# Patient Record
Sex: Female | Born: 1940 | Race: Black or African American | Hispanic: No | Marital: Married | Smoking: Former smoker
Health system: Southern US, Community
[De-identification: ages and names within clinical notes are randomized; demographics above are authoritative.]

## PROBLEM LIST (undated history)

## (undated) DIAGNOSIS — J449 Chronic obstructive pulmonary disease, unspecified: Secondary | ICD-10-CM

## (undated) DIAGNOSIS — I10 Essential (primary) hypertension: Secondary | ICD-10-CM

## (undated) DIAGNOSIS — I509 Heart failure, unspecified: Secondary | ICD-10-CM

## (undated) DIAGNOSIS — E785 Hyperlipidemia, unspecified: Secondary | ICD-10-CM

## (undated) DIAGNOSIS — J189 Pneumonia, unspecified organism: Secondary | ICD-10-CM

## (undated) HISTORY — DX: Essential (primary) hypertension: I10

## (undated) HISTORY — PX: TOTAL ABDOMINAL HYSTERECTOMY: SHX209

## (undated) HISTORY — DX: Chronic obstructive pulmonary disease, unspecified: J44.9

## (undated) HISTORY — DX: Heart failure, unspecified: I50.9

## (undated) HISTORY — PX: COLECTOMY: SHX59

## (undated) HISTORY — PX: HERNIA REPAIR: SHX51

## (undated) HISTORY — PX: HIP SURGERY: SHX245

---

## 2006-11-24 ENCOUNTER — Encounter: Payer: Self-pay | Admitting: Internal Medicine

## 2006-11-24 ENCOUNTER — Ambulatory Visit: Payer: Self-pay | Admitting: Cardiology

## 2007-01-06 ENCOUNTER — Ambulatory Visit (HOSPITAL_COMMUNITY): Admission: RE | Admit: 2007-01-06 | Discharge: 2007-01-06 | Payer: Self-pay | Admitting: Cardiology

## 2007-01-13 ENCOUNTER — Encounter: Payer: Self-pay | Admitting: Internal Medicine

## 2007-01-13 ENCOUNTER — Ambulatory Visit (HOSPITAL_COMMUNITY): Admission: RE | Admit: 2007-01-13 | Discharge: 2007-01-13 | Payer: Self-pay | Admitting: Cardiology

## 2007-02-16 ENCOUNTER — Encounter: Payer: Self-pay | Admitting: Internal Medicine

## 2008-10-09 ENCOUNTER — Ambulatory Visit: Admission: RE | Admit: 2008-10-09 | Discharge: 2008-10-09 | Payer: Self-pay | Admitting: Internal Medicine

## 2008-10-09 ENCOUNTER — Ambulatory Visit: Payer: Self-pay | Admitting: Internal Medicine

## 2008-10-09 DIAGNOSIS — J309 Allergic rhinitis, unspecified: Secondary | ICD-10-CM | POA: Insufficient documentation

## 2008-10-09 DIAGNOSIS — J4489 Other specified chronic obstructive pulmonary disease: Secondary | ICD-10-CM | POA: Insufficient documentation

## 2008-10-09 DIAGNOSIS — I10 Essential (primary) hypertension: Secondary | ICD-10-CM | POA: Insufficient documentation

## 2008-10-09 DIAGNOSIS — J449 Chronic obstructive pulmonary disease, unspecified: Secondary | ICD-10-CM | POA: Insufficient documentation

## 2008-10-09 DIAGNOSIS — R059 Cough, unspecified: Secondary | ICD-10-CM | POA: Insufficient documentation

## 2008-10-09 DIAGNOSIS — R05 Cough: Secondary | ICD-10-CM

## 2008-11-01 ENCOUNTER — Ambulatory Visit: Payer: Self-pay | Admitting: Internal Medicine

## 2008-11-07 ENCOUNTER — Encounter: Payer: Self-pay | Admitting: Internal Medicine

## 2008-11-11 ENCOUNTER — Ambulatory Visit: Payer: Self-pay | Admitting: Internal Medicine

## 2008-11-14 ENCOUNTER — Telehealth: Payer: Self-pay | Admitting: Internal Medicine

## 2009-01-21 ENCOUNTER — Inpatient Hospital Stay (HOSPITAL_COMMUNITY): Admission: RE | Admit: 2009-01-21 | Discharge: 2009-01-29 | Payer: Self-pay | Admitting: Orthopedic Surgery

## 2009-01-21 ENCOUNTER — Ambulatory Visit: Payer: Self-pay | Admitting: Pulmonary Disease

## 2009-01-24 ENCOUNTER — Ambulatory Visit: Payer: Self-pay | Admitting: Vascular Surgery

## 2009-01-24 ENCOUNTER — Encounter (INDEPENDENT_AMBULATORY_CARE_PROVIDER_SITE_OTHER): Payer: Self-pay | Admitting: Orthopedic Surgery

## 2010-07-16 IMAGING — CR DG PORTABLE PELVIS
1 series · 1 of 1 positions shown · non-contrast
Comparison: 01/21/2009

CLINICAL DATA: Osteoarthritis of the right hip.  Arthroplasty.

PORTABLE PELVIS

[AP]
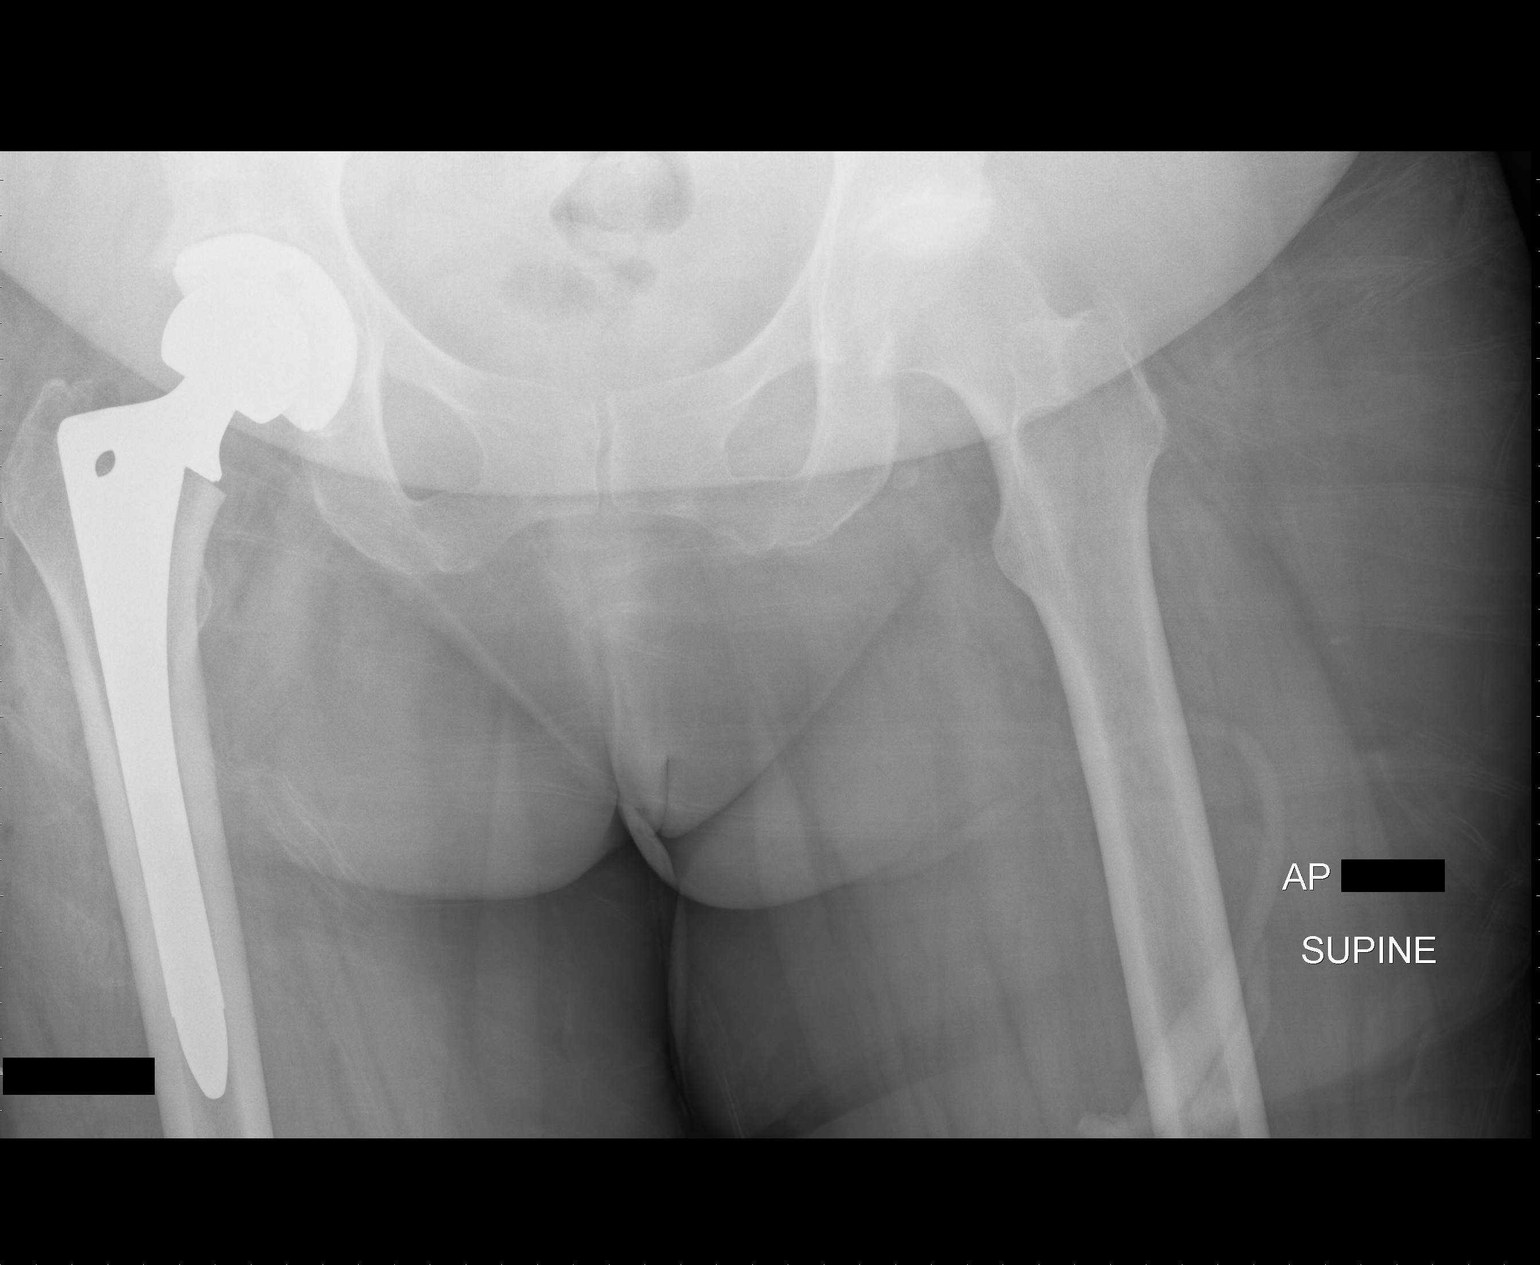

[1 of 1 positions shown; findings below may reference images not displayed]

FINDINGS: Frontal projection of the pelvis centered on the lower
pelvis demonstrates right-sided hip prosthesis in place without
fracture or acute complicating feature identified.  The soft
tissues lateral to the greater trochanter were excluded.
IMPRESSION: 1.  Right hip prosthesis is in place without fracture or acute
complicating feature.

## 2010-07-16 IMAGING — CR DG CHEST 2V
2 series · 2 of 2 positions shown · non-contrast
Comparison: None

CLINICAL DATA: Right hip osteoarthritis.  Shortness of breath.
COPD.  Hypertension.

CHEST - 2 VIEW

[view not recorded (1 of 2)]
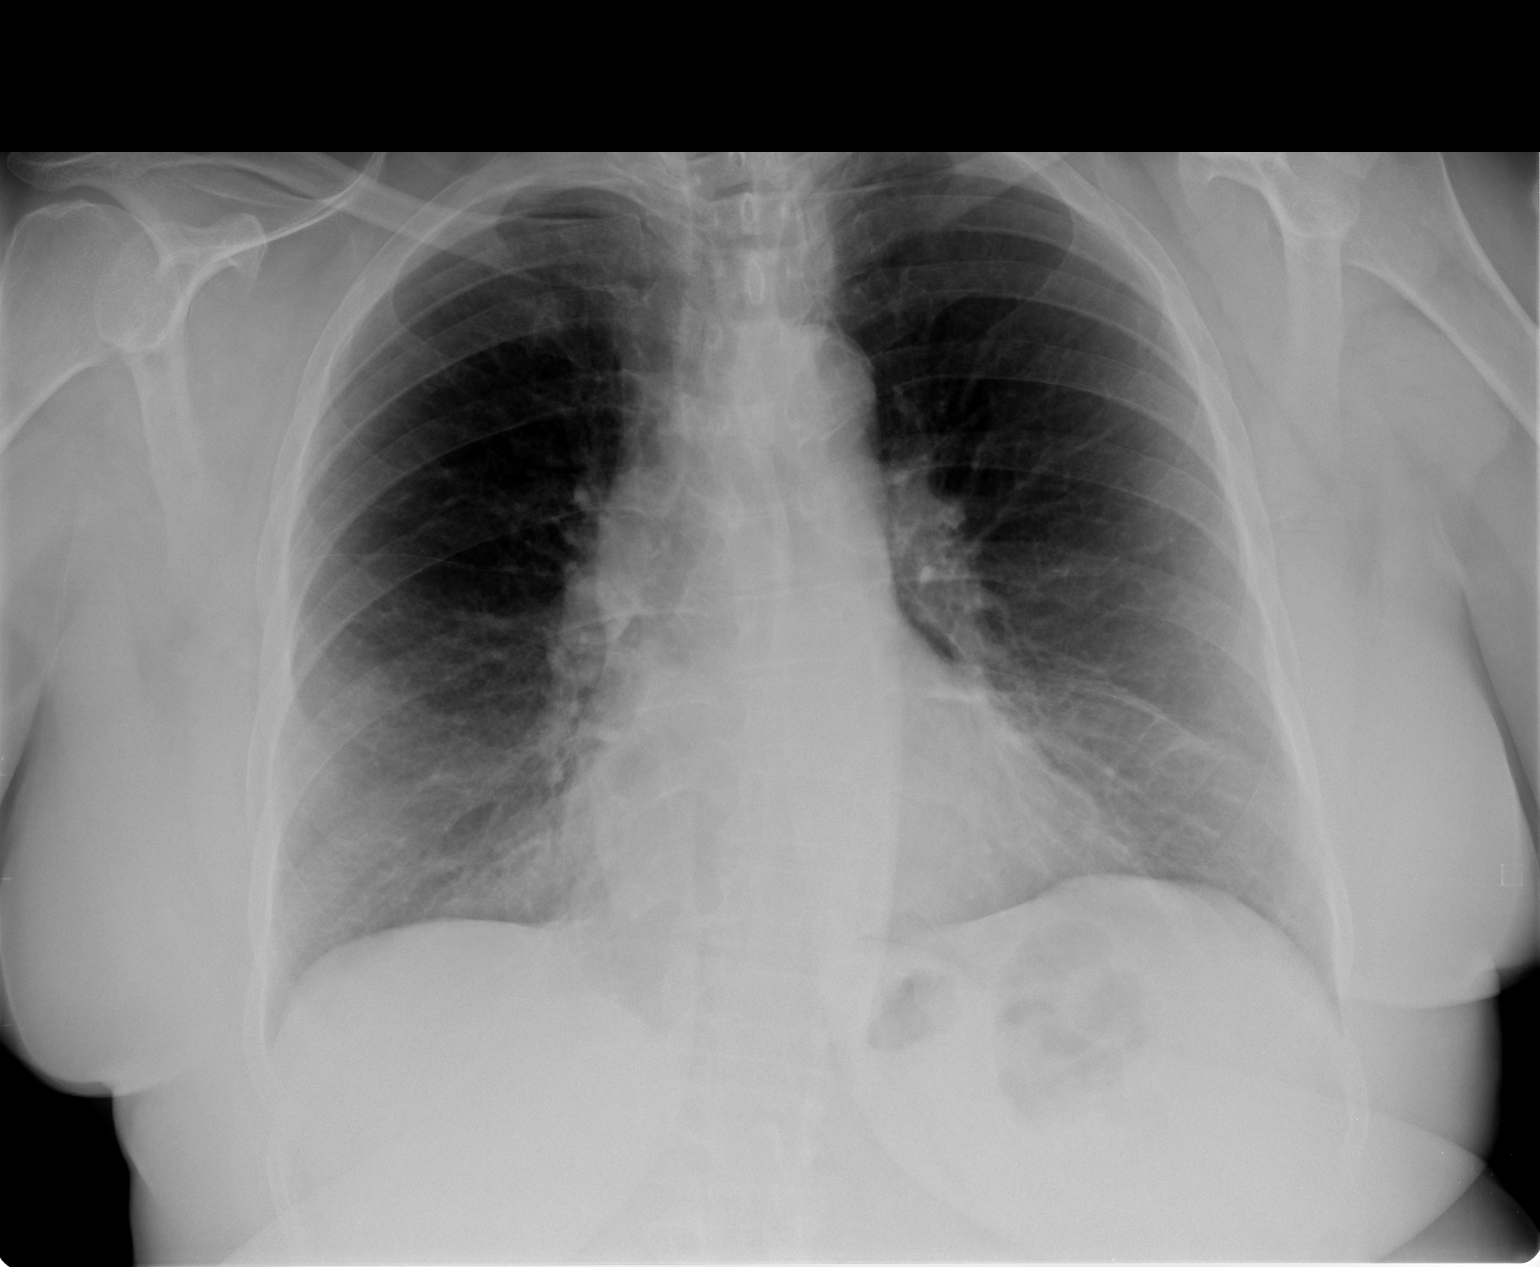

[view not recorded (2 of 2)]
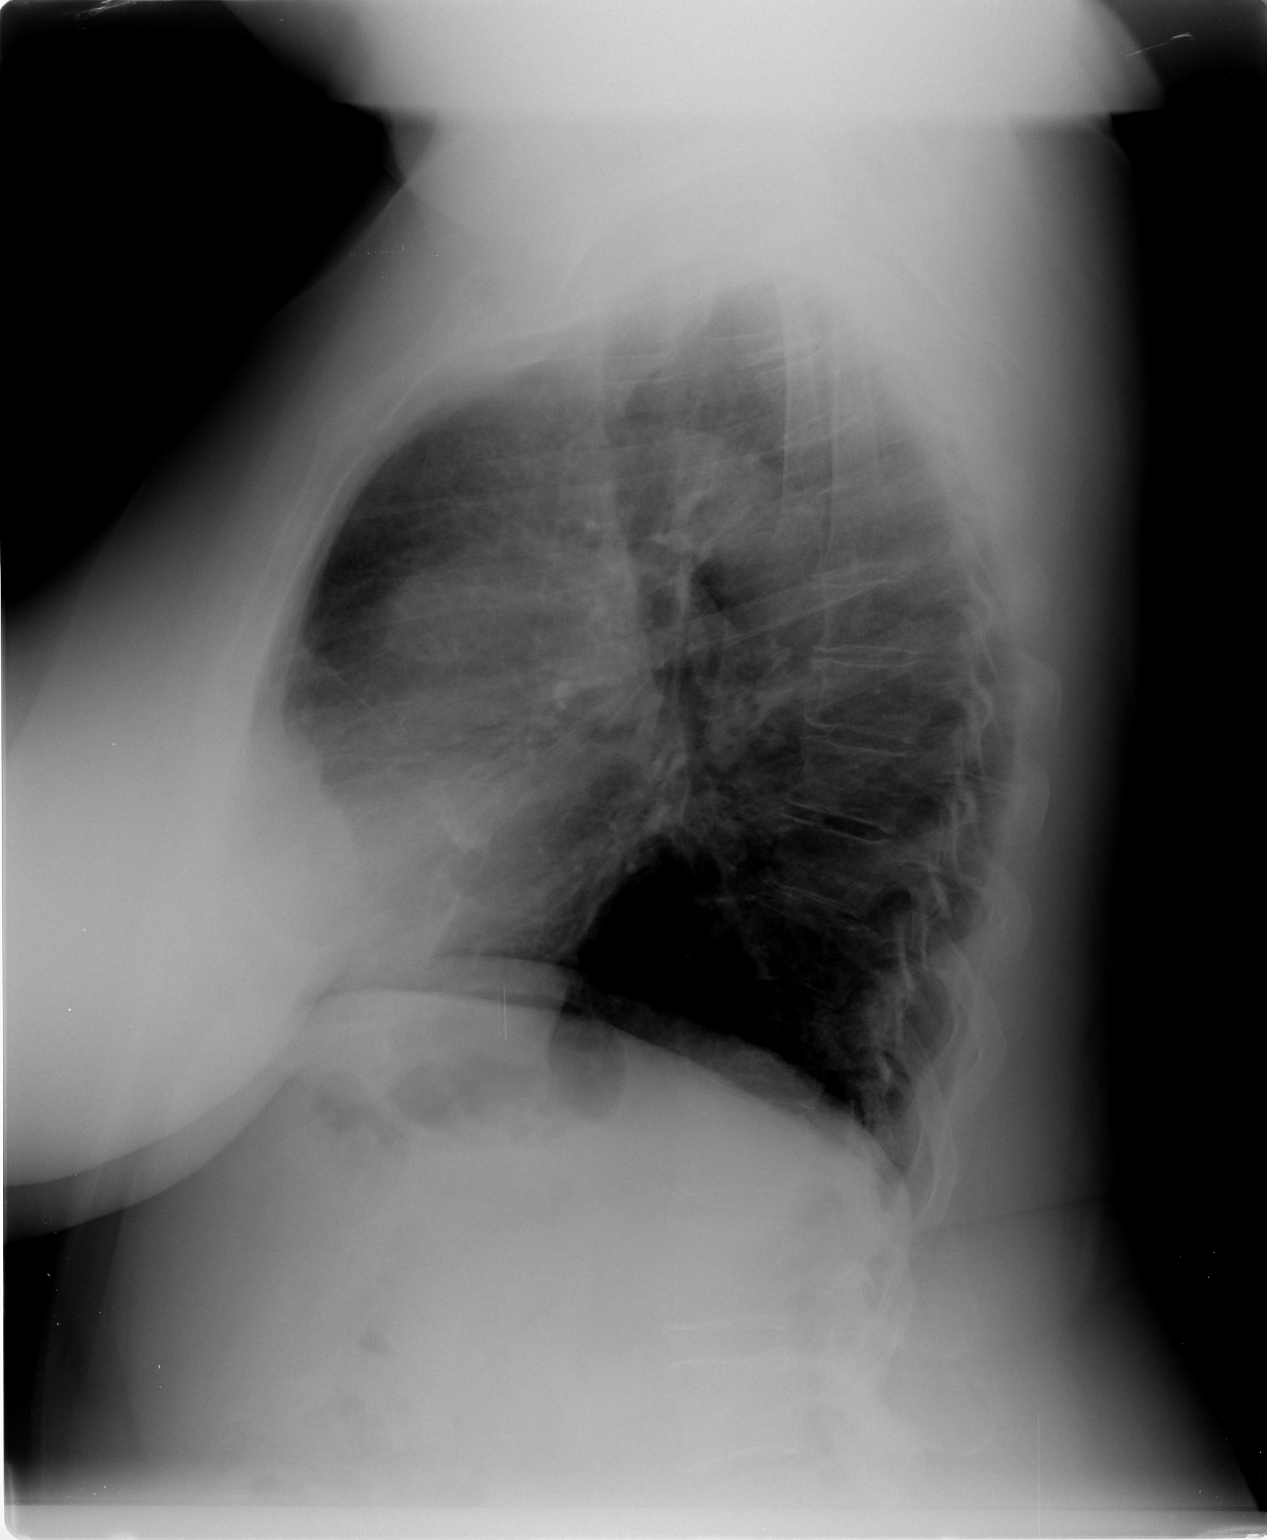

[2 of 2 positions shown; findings below may reference images not displayed]

FINDINGS: There is apparent volume loss and linear density in the
lingula most compatible with atelectasis or scarring.

Faint linear density projecting over the right first rib and likely
represents a confluence of osseous and vascular shadows.
IMPRESSION: 1.  Lingular scarring versus subsegmental atelectasis.
2.  No acute thoracic findings are identified.

## 2010-09-12 LAB — PROTIME-INR
INR: 1.7 — ABNORMAL HIGH (ref 0.00–1.49)
INR: 1.9 — ABNORMAL HIGH (ref 0.00–1.49)
Prothrombin Time: 14 seconds (ref 11.6–15.2)
Prothrombin Time: 24.1 seconds — ABNORMAL HIGH (ref 11.6–15.2)
Prothrombin Time: 24.2 seconds — ABNORMAL HIGH (ref 11.6–15.2)

## 2010-09-12 LAB — CARDIAC PANEL(CRET KIN+CKTOT+MB+TROPI)
CK, MB: 2.7 ng/mL (ref 0.3–4.0)
Relative Index: 0.9 (ref 0.0–2.5)
Total CK: 246 U/L — ABNORMAL HIGH (ref 7–177)
Total CK: 269 U/L — ABNORMAL HIGH (ref 7–177)
Troponin I: 0.02 ng/mL (ref 0.00–0.06)
Troponin I: 0.02 ng/mL (ref 0.00–0.06)

## 2010-09-12 LAB — CBC
HCT: 23.7 % — ABNORMAL LOW (ref 36.0–46.0)
HCT: 29.8 % — ABNORMAL LOW (ref 36.0–46.0)
Hemoglobin: 10.2 g/dL — ABNORMAL LOW (ref 12.0–15.0)
Hemoglobin: 8 g/dL — ABNORMAL LOW (ref 12.0–15.0)
Hemoglobin: 9.6 g/dL — ABNORMAL LOW (ref 12.0–15.0)
MCHC: 33.7 g/dL (ref 30.0–36.0)
MCHC: 33.8 g/dL (ref 30.0–36.0)
MCHC: 34 g/dL (ref 30.0–36.0)
MCHC: 34.4 g/dL (ref 30.0–36.0)
MCHC: 34.6 g/dL (ref 30.0–36.0)
MCV: 92.8 fL (ref 78.0–100.0)
MCV: 93.2 fL (ref 78.0–100.0)
MCV: 94.5 fL (ref 78.0–100.0)
Platelets: 206 10*3/uL (ref 150–400)
Platelets: 207 10*3/uL (ref 150–400)
Platelets: 242 10*3/uL (ref 150–400)
Platelets: 255 10*3/uL (ref 150–400)
RBC: 3.02 MIL/uL — ABNORMAL LOW (ref 3.87–5.11)
RBC: 3.21 MIL/uL — ABNORMAL LOW (ref 3.87–5.11)
RBC: 3.29 MIL/uL — ABNORMAL LOW (ref 3.87–5.11)
RDW: 15 % (ref 11.5–15.5)
RDW: 15.1 % (ref 11.5–15.5)
RDW: 15.1 % (ref 11.5–15.5)
WBC: 6.2 10*3/uL (ref 4.0–10.5)
WBC: 6.4 10*3/uL (ref 4.0–10.5)
WBC: 6.7 10*3/uL (ref 4.0–10.5)
WBC: 6.9 10*3/uL (ref 4.0–10.5)
WBC: 7.1 10*3/uL (ref 4.0–10.5)
WBC: 7.2 10*3/uL (ref 4.0–10.5)

## 2010-09-12 LAB — HEPARIN LEVEL (UNFRACTIONATED)
Heparin Unfractionated: 0.19 IU/mL — ABNORMAL LOW (ref 0.30–0.70)
Heparin Unfractionated: 0.21 IU/mL — ABNORMAL LOW (ref 0.30–0.70)
Heparin Unfractionated: 0.33 IU/mL (ref 0.30–0.70)
Heparin Unfractionated: 0.46 IU/mL (ref 0.30–0.70)
Heparin Unfractionated: 0.48 IU/mL (ref 0.30–0.70)
Heparin Unfractionated: 0.92 IU/mL — ABNORMAL HIGH (ref 0.30–0.70)

## 2010-09-12 LAB — BASIC METABOLIC PANEL
CO2: 33 mEq/L — ABNORMAL HIGH (ref 19–32)
Calcium: 8 mg/dL — ABNORMAL LOW (ref 8.4–10.5)
Calcium: 8.3 mg/dL — ABNORMAL LOW (ref 8.4–10.5)
Calcium: 8.4 mg/dL (ref 8.4–10.5)
Calcium: 8.9 mg/dL (ref 8.4–10.5)
Chloride: 95 mEq/L — ABNORMAL LOW (ref 96–112)
Creatinine, Ser: 0.68 mg/dL (ref 0.4–1.2)
Creatinine, Ser: 0.7 mg/dL (ref 0.4–1.2)
Creatinine, Ser: 0.75 mg/dL (ref 0.4–1.2)
Creatinine, Ser: 0.88 mg/dL (ref 0.4–1.2)
GFR calc Af Amer: >60 mL/min (ref >60)
GFR calc Af Amer: >60 mL/min (ref >60)
GFR calc Af Amer: >60 mL/min (ref >60)
GFR calc non Af Amer: 41 mL/min — ABNORMAL LOW (ref >60)
GFR calc non Af Amer: >60 mL/min (ref >60)
Glucose, Bld: 152 mg/dL — ABNORMAL HIGH (ref 70–99)
Glucose, Bld: 157 mg/dL — ABNORMAL HIGH (ref 70–99)
Glucose, Bld: 165 mg/dL — ABNORMAL HIGH (ref 70–99)
Potassium: 4.3 mEq/L (ref 3.5–5.1)
Sodium: 132 mEq/L — ABNORMAL LOW (ref 135–145)
Sodium: 133 mEq/L — ABNORMAL LOW (ref 135–145)

## 2010-09-12 LAB — URINALYSIS, ROUTINE W REFLEX MICROSCOPIC
Bilirubin Urine: NEGATIVE
Glucose, UA: NEGATIVE mg/dL
Ketones, ur: NEGATIVE mg/dL
Nitrite: NEGATIVE
Protein, ur: NEGATIVE mg/dL
Specific Gravity, Urine: 1.008 (ref 1.005–1.030)
Specific Gravity, Urine: 1.01 (ref 1.005–1.030)
Urobilinogen, UA: 0.2 mg/dL (ref 0.0–1.0)
pH: 7 (ref 5.0–8.0)

## 2010-09-12 LAB — URINALYSIS, MICROSCOPIC ONLY
Hgb urine dipstick: NEGATIVE
Ketones, ur: NEGATIVE mg/dL
Protein, ur: NEGATIVE mg/dL
Urobilinogen, UA: 1 mg/dL (ref 0.0–1.0)

## 2010-09-12 LAB — CROSSMATCH: Antibody Screen: NEGATIVE

## 2010-09-12 LAB — URINE CULTURE: Colony Count: >=100000

## 2010-09-12 LAB — DIFFERENTIAL
Eosinophils Absolute: 0.1 10*3/uL (ref 0.0–0.7)
Eosinophils Relative: 3 % (ref 0–5)
Lymphs Abs: 0.6 10*3/uL — ABNORMAL LOW (ref 0.7–4.0)
Monocytes Absolute: 0.4 10*3/uL (ref 0.1–1.0)
Monocytes Relative: 12 % (ref 3–12)

## 2010-09-12 LAB — COMPREHENSIVE METABOLIC PANEL
ALT: 31 U/L (ref 0–35)
AST: 25 U/L (ref 0–37)
Calcium: 9.1 mg/dL (ref 8.4–10.5)
Creatinine, Ser: 1.05 mg/dL (ref 0.4–1.2)
GFR calc Af Amer: >60 mL/min (ref >60)
Sodium: 137 mEq/L (ref 135–145)
Total Protein: 6.5 g/dL (ref 6.0–8.3)

## 2010-09-12 LAB — APTT: aPTT: 30 seconds (ref 24–37)

## 2010-09-15 LAB — BLOOD GAS, ARTERIAL
Drawn by: 101881
FIO2: 0.21 %
pCO2 arterial: 48.2 mmHg — ABNORMAL HIGH (ref 35.0–45.0)
pH, Arterial: 7.434 — ABNORMAL HIGH (ref 7.350–7.400)

## 2010-10-20 NOTE — Consult Note (Signed)
Mia Howe, Mia Howe              ACCOUNT NO.:  [id number]   MEDICAL RECORD NO.:  [id number]          PATIENT TYPE:  INP   LOCATION:  2550                         FACILITY:  [facility]   PHYSICIAN:  Felipa Evener, MD  DATE OF BIRTH:  18-Aug-1940   DATE OF CONSULTATION:  01/21/2009  DATE OF DISCHARGE:                                 CONSULTATION   PRIMARY CARE PHYSICIAN:  Osvaldo Shipper. Spruill, MD.   PULMONOLOGIST:  Kalman Shan, MD.   REASON FOR CONSULTATION:  History of chronic obstructive pulmonary  disease.  She is O2 and bronchodilator dependent, and history of  frequent pneumonias.   HISTORY OF PRESENT ILLNESS:  Mia Howe is a 70 year old African  American female who is O2 and bronchodilator dependent and morbidly  obese African American female, who had preop pulmonary clearance per Dr.  Marchelle Gearing performed.  She underwent a right total hip replacement on  January 21, 2009 and was transferred to the PACU at approximately 1240  hours, and extubated without acute distress.  Pulmonary critical care  was asked to consult due to her history of COPD.  Currently, she is in  no acute distress.   SOCIAL HISTORY:  She quit smoking approximately 2005.  For 30 years, she  smoked 1 pack per day.  She is married.  She lives with her husband and  daughter.  She is retired.   FAMILY HISTORY:  Mother with DVT and colon cancer.  Sister with DVT.  Father with lung cancer.  Daughter with chest wall cancer.   ALLERGIES:  LEVAQUIN.   CURRENT HOME MEDICATIONS:  1. Amlodipine 5 mg daily.  2. Hydrochlorothiazide 12.5 mg daily.  3. Diclofenac 75 mg daily.  4. Darvocet 100/650 p.r.n. q.6 h.  5. Lyrica 50 mg p.r.n. for pain.  6. Joint supplement.  7. Aspirin 81 mg daily.  8. Spiriva 18 mcg one puff daily.  9. Advair 550 one puff b.i.d.   REVIEW OF SYSTEMS:  Not available secondary to altered mental status  with recent anesthesia.   PHYSICAL EXAMINATION:  GENERAL:  Well-nourished,  well-developed [place]  American female who is in no acute distress post-total hip replacement  on the right.  She reports no respiratory distress.  She does complain  of pain on the right hip.  VITAL SIGNS:  Blood pressure 114/76, pulse 96, respiratory rate 18, sats  are 94% on 2 liter nasal cannula.  HEENT:  No JVD is appreciated.  No cervical adenopathy is appreciated.  CHEST:  Clear to auscultation with decreased breath sounds at the bases.  CARDIAC:  Heart sounds are regular.  Regular rate and rhythm.  A 12-lead  EKG shows normal sinus rhythm.  ABDOMEN:  Obese.  Positive bowel sounds.  EXTREMITIES:  Right long leg knee immobilizer is in place.  Bilateral  feet are warm without edema.   DIAGNOSTICS:  Portable chest x-ray is pending.   LABORATORY DATA:  All labs are pending.   IMPRESSION/PLAN:  1. Chronic obstructive pulmonary disease, oxygen and bronchodilator      dependent, status post total right hip replacement.  Therefore, we  will hold her Advair and Spiriva x 24 hours till awake to handle      inhaled beta agonist.  We will place her on albuterol/Atrovent nebs      and Pulmicort nebs q.6 h. x 24 hours, then return to her previous      pharmaceutical regimen.  At discharge, we will also give her a      flutter valve.  We will mobilize her.  We will check a chest x-ray      for baseline.  2. Hypertension.  We will continue her home medications in the a.m.      for hypertension.  3. Morbid obesity.  She will need weight loss.  4. Chronic pain.  She is on home analgesics.  Currently she will have      a PCA and narcotic on board.  These can be adjusted on an      outpatient basis.      Mia Dopp, MSN, ACNP      Felipa Evener, MD  Electronically Signed    SM/MEDQ  D:  01/21/2009  T:  01/21/2009  Job:  [id number]

## 2010-10-20 NOTE — Discharge Summary (Signed)
Mia Howe, Mia Howe              ACCOUNT NO.:  [id number]   MEDICAL RECORD NO.:  [id number]          PATIENT TYPE:  INP   LOCATION:  5029                         FACILITY:  [facility]   PHYSICIAN:  Lenard Galloway. Mortenson, M.D.DATE OF BIRTH:  09-Nov-1940   DATE OF ADMISSION:  01/21/2009  DATE OF DISCHARGE:                               DISCHARGE SUMMARY   DATE OF DISCHARGE:  January 24, 2009.   ADMISSION DIAGNOSIS:  Osteoarthritis right hip.   DISCHARGE DIAGNOSES:  1. Osteoarthritis right hip.  2. Chronic obstructive pulmonary disease / chronic bronchitis.  3. Previous cigarette smoker.  4. History of hypertension.  5. Hyponatremia.  6. Acute blood loss anemia.   PROCEDURE:  Right total hip arthroplasty.   HISTORY:  Mia Howe is a 70 year old African American female with  right hip and groin pain for the past 1-1/2 years.  She has been having  worsening pain especially with ambulation and activities of daily  living.  She is having difficulty with putting her shoes on and with  foot care.  Radiographically she was noted to have end-stage OA of the  right hip.  She has failed conservative treatment including NSAIDs.  The  pain was not extremely severe, stabbing in quality and she would like to  proceed with total hip arthroplasty.   HOSPITAL COURSE:  A 70 year old African American female admitted January 21, 2009.  After appropriate laboratory studies were obtained as well as  2 grams of Ancef IV on-call to the operating room she was taken to the  operating room where she underwent a right total hip arthroplasty by Dr.  Rinaldo Ratel assisted by Dr. Norlene Campbell and Oris Drone. Petrarca,  P.A.C.  She tolerated the procedure well.  She was then continued on  Ancef 1 gram IV q.6 h for a total of 3 doses postop.  Placed on Dilaudid  PCA pump for postop pain management.  Begun on Arixtra 2.5 mg subcu at 6  a.m. on August 18.  A knee immobilizer placed to the right leg when she  was in bed.  She was allowed to be weightbearing as tolerated.  Consult  with PT/OT and care management and social worker was obtained.  She was  also allowed to have her own SS4 unit for pain management.  A wound VAC  was placed initially over the incision for 24 hours.  This was run at 50  mmHg.  Wound VAC was discontinued the following day.  She was allowed  out of bed to a chair.  She was weaned off of her PCA but she was  continued on her O2 because of her sats being less than 92%.  Stat EKG  and cardiac enzymes were ordered because of some chest symptoms and  these were essentially unremarkable.  She was given 1 unit of packed  cells on August 18 over 2-3 hours with 10 of Lasix IV after a period.  Serum osmolality was ordered.  Her Lyrica was discontinued on August 18.  Consultation with Dr. Shana Chute was asked for as well as [facility].  She was  placed on metoprolol 12.5 mg p.o. b.i.d., hold for  blood pressure less than 90 and heart rate less than 55.  Milk of  Magnesia was ordered for her constipation.  Her Foley was kept until  August 19.  We did also discontinue her hydrocodone / Norco as well as  Percocet and began Dilaudid 2-4 mg p.o. q.3 h p.r.n. pain.  Dilaudid  rescue dose 0.5 mg IV q.3-4 h p.r.n. for pain was also given.  On August  19 she was given 1 unit of packed cells over 2-3 hours with 10 of Lasix  after the unit.  Dopplers were also ordered.  The remainder of her  hospital course was essentially unremarkable.  Hopeful discharge on  January 24, 2009.   RADIOGRAPHIC STUDIES:  August 17 revealed right hip prosthesis in place  without fracture or acute complicating features.  EKG from January 22, 2009, revealed sinus tachycardia with fusion complexes otherwise normal.  January 15, 2009, revealed normal sinus rhythm with left axis deviation.   LABORATORY STUDIES:  Admitted with a hemoglobin of 10.3, hematocrit  30.3%, white count 4100, platelets 207,000.   Hemoglobin of January 23, 2009, was 8.4, hematocrit 24.6%, white count 6400, platelet was 206,000.  One unit of packed cells was given between preop and this lab work.  Preop sodium 137, potassium 3.8, chloride 98, CO2 31, glucose 130, BUN  14, creatinine 0.05, GFR was greater than 60, bilirubin 0.6, alk phos  69, SGOT was 25, SGPT was 31, total protein 6.5, albumin 3.5, calcium  was 9.1.  Sodium of August 19 was 132, potassium 4.2, chloride 93, CO2  34, glucose 149, BUN 15, creatinine 0.88 and GFR was greater than 60.  Cardiac enzymes revealed CPKs of 302, 269 and 246.  Her CK-MBs were 2.7,  2.2 and 1.5 respectively.  Relative index was less than 1 at all times.  Troponins were 0.01, 0.02 and 0.02.  Urinalysis preop revealed  essentially yellow, clear urine with no micro done.  Urine culture  revealed greater than 100,000 colonies of multiple bacterial  morphotypes.   DISCHARGE INSTRUCTIONS:  She is to be discharged on a heart healthy  diet.  She is increasing her activity slowly.  Use her walker,  weightbearing as tolerated as taught in physical therapy.  She needs to  follow the posterior hip precautions.  She may shower on Saturday.  No  lifting or driving for 6 weeks.  Keep the incision clean and dry and  change the dressing at least daily or every 12 hours depending upon if  there is drainage.  This should be done with 4x4, sterile gauze and  tape.  Physical therapy for ambulation, weightbearing as tolerated.   MEDICATIONS:  1. To include Spiriva inhaler 1 puff daily.  2. Advair inhaler 2 puffs b.i.d.  3. Arixtra 2.5 mg subcu q.6 a.m. last dose on February 01, 2009.  Then      she is to begin aspirin 81 mg daily.  4. Colace 100 mg b.i.d.  5. Amlodipine 5 mg daily.  6. Hydrochlorothiazide 12.5 mg daily.  7. Metoprolol 12.5 mg 1/2 tablet b.i.d., hold for systolic pressure      less than 90 and heart rate less than 55.  8. Albuterol 2.5 mg in 3 mL nebulizer solution q.6 h p.r.n.   9. Robaxin 500 mg one p.o. q.6 h p.r.n. spasms.  10.Ambien 5 mg q.h.s. p.r.n. sleep.  11.Senokot-S 1 tablet as needed for constipation at bedtime.  12.Fleet enemas p.r.n.  13.Dulcolax suppository p.r.n. constipation.  14.Hydromorphone / Dilaudid 4 mg p.o. q.3 h p.r.n. pain.   FOLLOWUP:  She will need to follow back up with [place] on January 17, 2009,  in Dr. Andreas Blower office.  She needs to have someone call the office at  [phone] to make that appointment.   CONDITION:  Discharged in improved condition.      Oris Drone Petrarca, P.A.-C.      Rodney A. Chaney Malling, M.D.  Electronically Signed    BDP/MEDQ  D:  01/23/2009  T:  01/23/2009  Job:  [id number]

## 2010-10-20 NOTE — Op Note (Signed)
Mia Howe, Mia Howe              ACCOUNT NO.:  [id number]   MEDICAL RECORD NO.:  [id number]          PATIENT TYPE:  INP   LOCATION:  5029                         FACILITY:  [facility]   PHYSICIAN:  Lenard Galloway. Mortenson, M.D.DATE OF BIRTH:  10-Sep-1940   DATE OF PROCEDURE:  01/21/2009  DATE OF DISCHARGE:                               OPERATIVE REPORT   JUSTIFICATION:  A 70 year old with progressive osteoarthritis, right  hip.  She has now failed all conservative care.  She has worked up  extensively preoperatively prior to surgical procedure and she has been  cleared.  Complication discussed preoperatively.  Questions, answered  and encouraged extensively and the patient wishes to proceed.   PREOPERATIVE DIAGNOSIS:  End-stage osteoarthritis, right hip.   POSTOPERATIVE DIAGNOSIS:  End-stage osteoarthritis, right hip.   OPERATION:  Right total hip using a small stature 13.5-mm diameter, AML  femoral component with a +4, 10 degree acetabular liner, pinnacle  marathon cup with 54 mm outside diameter and a pinnacle sector 11  acetabular cup outside diameter 54 mm, apex hole eliminator, and a metal-  on-metal femoral head with a 36-mm head, and a -2 neck.   SURGEON:  Lenard Galloway. Chaney Malling, MD   ASSISTANT:  1. Claude Manges. Cleophas Dunker, MD  2. Oris Drone Petrarca, PA-C   ANESTHESIA:  General.   PROCEDURE:  The patient was placed on the table in supine position.  After satisfactory spinal anesthesia, the patient was placed with the  right hip up.  The entire right lower extremity was prepped with  DuraPrep and draped out in the usual manner.  Vi-Drape was placed over  the operative site.  An incision was made starting above the greater  trochanter and carried distally.  Incision carried down to the fascia  lata.  There was about 4 cm of subcutaneous tissue.  Large self-  retaining retractor was put in position.  Bleeders were coagulated.  Tensor fascia lata was split and the posterior aspect of the  femoral  neck was then identified.  Piriformis identified, tagged, released and  swept through the midline.  An incision made through the capsule and  this exposed the head and neck.  The hip was manipulated and head was  dislocated.  Then neck was then cut of to appropriate length and angle  using the guide.  A drill was then placed in the area of the piriformis  fossa and drill was placed through the remainder of the neck and a canal  fundus passed down the femoral canal.  There was good capture of the  canal finder distally.  A series of fully fluted reamers were attached  down, this was reamed up to 13 mm outside diameter.  Series of broaches  were then passed down the femoral canal and the 13.5 seated very nice in  the calcar and calcar rim was used to smooth this off.  There was  excellent capture stability of the femoral component.  This was then  removed.  Attention was then turned to the acetabulum.  A large Cobb  retractor was placed around the rim of the acetabulum.  The labrum was  excised.  There were large osteophytes in superior margin.  A series of  cheese cutter reamers were used and this was reamed out to 53 mm  diameter.  Good bleeding bone was achieved.  All cartilage was taken off  a 52-mm trial was placed down in the acetabulum and that seated very  nicely completely.  It is felt that the 54 mm final cup was ideal.  The  54 mm sector 2 acetabular cup was then driven down into the acetabulum.  Excellent press fit was achieved.  It is extremely stable.  The trial  poly liner was put in position.  A trial broach was passed down the  femoral canal.  Several size femoral necks were used and the -2 neck  gave this a very stable hip at all positions.  Leg lengths were  essentially equal.  I was very pleased with construct, stability, and  range of motion of the hip.  Hip was dislocated and all the components  were removed except for the sector 2 pinnacle cup, which was left  in  place.  An Apex hole eliminator was inserted.  The final pinnacle  marathon cup was snapped and fitted in place.  The final femoral  components then driven down the femoral canal and seated very nicely in  the calcar.  The final head was placed over the trunnion tapped in place  and the hip was relocated.  Hip was put through a full range of motion.  There was excellent stability in all positions.  It was extremely  stable.  Bleeders were then coagulated.  We did use FloSeal around the  wound.  The capsule was closed with heavy Ethibond suture.  Tensor  fascia lata was closed with heavy Vicryl and the subcutaneous layer was  closed in 4 separate layers as it was quite large by thick measuring 4  cm to 5 cm insert in the areas.  The skin was closed with stainless  steel staples.  A wound vac was placed on the wound to remove any  drainage from very deep subcutaneous area to help minimize the  possibility of fluid collection and possible infection.  Technically,  this procedure went extremely well.  I was very pleased with final  construct, range of motion, and stability.  She was returned to recovery  room and doing very well.  Drains, wound vac.  Complications none.      Rodney A. Chaney Malling, M.D.  Electronically Signed     RAM/MEDQ  D:  01/21/2009  T:  01/21/2009  Job:  [id number]

## 2010-10-20 NOTE — Discharge Summary (Signed)
Mia Howe, Mia Howe              ACCOUNT NO.:  [id number]   MEDICAL RECORD NO.:  [id number]          PATIENT TYPE:  INP   LOCATION:  5029                         FACILITY:  [facility]   PHYSICIAN:  Lenard Galloway. Mortenson, M.D.DATE OF BIRTH:  March 24, 1941   DATE OF ADMISSION:  01/21/2009  DATE OF DISCHARGE:  01/30/2009                               DISCHARGE SUMMARY   ADDENDUM:   ADMISSION DIAGNOSIS:  Osteoarthritis of the right hip.   DISCHARGE DIAGNOSES:  1. Osteoarthritis of the right hip.  2. 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  3. Previous cigarette smoker.  4. History of hypertension.  5. Hyponatremia.  6. Acute blood loss anemia.  7. Deep venous thrombosis right lower extremity.   PROCEDURE:  Right total hip arthroplasty.   The remaining of the discharge summary is a synopsis of her hospital  course until January 23, 2009.  On January 23, 2009, she did have a  Doppler which had been ordered on January 23, 2009 and it was not  completed until January 24, 2009.  It was noted that she did have a DVT  and needed to be placed on heparin and Coumadin.  This was started on  January 24, 2009.  She is now over the hospital course and has had  elevation of the pro-time greater than 2.0 INR and therefore is now  indicated for discharge.   DIAGNOSTICS:  Right lower extremity venous Doppler revealed thrombus in  the right popliteal vein.  No evidence of Baker's cyst on the left.  No  obvious evidence of superficial thrombus.   She was then treated with heparin IV and Coumadin.  Now has had INR  greater than 2.0 for the last 2 days.  Her heparin has been  discontinued.  She is now ready for discharge.   DISCHARGE INSTRUCTIONS:  1. She is to be discharged on a heart-healthy diet.  2. Increase her activity slowly.  Use a walker, weightbearing as      tolerated as taught in PT.  Needs to follow the posterior      precautions.  3. May shower with the bandage on.  This  bandage should be changed on      January 31, 2009 and just 4x4 dressings with tape is necessary after      that.  Keep the incision clean and dry.  4. No driving or lifting for 6 weeks.  5. Physical therapy for ambulation, weightbearing as tolerated.   DISCHARGE MEDICATIONS:  1. Spiriva inhaler 1 puff daily.  2. Advair inhaler 2 puffs b.i.d.  3. Discontinue Arixtra.  4. Colace 100 mg b.i.d.  5. Amlodipine 5 mg daily.  6. Hydrochlorothiazide 12.5 mg daily.  7. Metoprolol 12.5 one-half tablet b.i.d., hold for systolic pressure      less than 90 and heart rate less than 55.  8. Albuterol 2.5 mg in 3 mL nebulizer solution q.6 h. p.r.n.  9. Robaxin 500 mg one tablet every six hours as needed for spasms.  10.Ambien 5 mg h.s. p.r.n. sleep.  11.Senokot-S 1 tablet as needed for constipation at bedtime.  12.Fleet's enema p.r.n.  13.Dulcolax suppository p.r.n. constipation.  14.Dilaudid 2 mg tablets 1-2 tablets every 3-4 hours as needed for      pain.  15.Coumadin as per protocol keeping the INR greater than 2-2.5.   FOLLOW UP:  She will need to be followed back up in the office on  Monday, February 03, 2009 for suture removal.  She will need to call the  office at [phone] to make an appointment.   CONDITION ON DISCHARGE:  Improved.      Oris Drone Petrarca, P.A.-C.      Rodney A. Chaney Malling, M.D.  Electronically Signed    BDP/MEDQ  D:  01/29/2009  T:  01/29/2009  Job:  [id number]

## 2015-06-15 DIAGNOSIS — J449 Chronic obstructive pulmonary disease, unspecified: Secondary | ICD-10-CM | POA: Diagnosis not present

## 2015-06-21 DIAGNOSIS — J449 Chronic obstructive pulmonary disease, unspecified: Secondary | ICD-10-CM | POA: Diagnosis not present

## 2015-06-26 DIAGNOSIS — N182 Chronic kidney disease, stage 2 (mild): Secondary | ICD-10-CM | POA: Diagnosis not present

## 2015-06-26 DIAGNOSIS — J449 Chronic obstructive pulmonary disease, unspecified: Secondary | ICD-10-CM | POA: Diagnosis not present

## 2015-06-26 DIAGNOSIS — Z6832 Body mass index (BMI) 32.0-32.9, adult: Secondary | ICD-10-CM | POA: Diagnosis not present

## 2015-06-26 DIAGNOSIS — J441 Chronic obstructive pulmonary disease with (acute) exacerbation: Secondary | ICD-10-CM | POA: Diagnosis not present

## 2015-06-30 DIAGNOSIS — J449 Chronic obstructive pulmonary disease, unspecified: Secondary | ICD-10-CM | POA: Diagnosis not present

## 2015-07-08 DIAGNOSIS — I509 Heart failure, unspecified: Secondary | ICD-10-CM | POA: Diagnosis not present

## 2015-07-14 DIAGNOSIS — J441 Chronic obstructive pulmonary disease with (acute) exacerbation: Secondary | ICD-10-CM | POA: Diagnosis not present

## 2015-08-11 DIAGNOSIS — J441 Chronic obstructive pulmonary disease with (acute) exacerbation: Secondary | ICD-10-CM | POA: Diagnosis not present

## 2015-08-23 DIAGNOSIS — J111 Influenza due to unidentified influenza virus with other respiratory manifestations: Secondary | ICD-10-CM | POA: Diagnosis not present

## 2015-08-25 DIAGNOSIS — J111 Influenza due to unidentified influenza virus with other respiratory manifestations: Secondary | ICD-10-CM | POA: Diagnosis not present

## 2015-08-25 DIAGNOSIS — R55 Syncope and collapse: Secondary | ICD-10-CM | POA: Diagnosis not present

## 2015-08-25 DIAGNOSIS — Z8739 Personal history of other diseases of the musculoskeletal system and connective tissue: Secondary | ICD-10-CM | POA: Diagnosis not present

## 2015-08-25 DIAGNOSIS — D649 Anemia, unspecified: Secondary | ICD-10-CM | POA: Diagnosis not present

## 2015-08-25 DIAGNOSIS — J449 Chronic obstructive pulmonary disease, unspecified: Secondary | ICD-10-CM | POA: Diagnosis not present

## 2015-09-11 DIAGNOSIS — J441 Chronic obstructive pulmonary disease with (acute) exacerbation: Secondary | ICD-10-CM | POA: Diagnosis not present

## 2015-09-24 DIAGNOSIS — H353131 Nonexudative age-related macular degeneration, bilateral, early dry stage: Secondary | ICD-10-CM | POA: Diagnosis not present

## 2015-09-24 DIAGNOSIS — H401131 Primary open-angle glaucoma, bilateral, mild stage: Secondary | ICD-10-CM | POA: Diagnosis not present

## 2015-09-24 DIAGNOSIS — H5203 Hypermetropia, bilateral: Secondary | ICD-10-CM | POA: Diagnosis not present

## 2015-09-24 DIAGNOSIS — Z961 Presence of intraocular lens: Secondary | ICD-10-CM | POA: Diagnosis not present

## 2015-09-25 DIAGNOSIS — Z0000 Encounter for general adult medical examination without abnormal findings: Secondary | ICD-10-CM | POA: Diagnosis not present

## 2015-09-25 DIAGNOSIS — Z1389 Encounter for screening for other disorder: Secondary | ICD-10-CM | POA: Diagnosis not present

## 2015-09-25 DIAGNOSIS — Z6831 Body mass index (BMI) 31.0-31.9, adult: Secondary | ICD-10-CM | POA: Diagnosis not present

## 2015-09-25 DIAGNOSIS — J440 Chronic obstructive pulmonary disease with acute lower respiratory infection: Secondary | ICD-10-CM | POA: Diagnosis not present

## 2015-09-30 DIAGNOSIS — Z8701 Personal history of pneumonia (recurrent): Secondary | ICD-10-CM | POA: Diagnosis not present

## 2015-09-30 DIAGNOSIS — R0689 Other abnormalities of breathing: Secondary | ICD-10-CM | POA: Diagnosis not present

## 2015-09-30 DIAGNOSIS — Z87891 Personal history of nicotine dependence: Secondary | ICD-10-CM | POA: Diagnosis not present

## 2015-09-30 DIAGNOSIS — J449 Chronic obstructive pulmonary disease, unspecified: Secondary | ICD-10-CM | POA: Diagnosis not present

## 2015-09-30 DIAGNOSIS — R0602 Shortness of breath: Secondary | ICD-10-CM | POA: Diagnosis not present

## 2015-10-03 DIAGNOSIS — I509 Heart failure, unspecified: Secondary | ICD-10-CM | POA: Diagnosis not present

## 2015-10-11 DIAGNOSIS — J441 Chronic obstructive pulmonary disease with (acute) exacerbation: Secondary | ICD-10-CM | POA: Diagnosis not present

## 2015-10-20 DIAGNOSIS — Z6831 Body mass index (BMI) 31.0-31.9, adult: Secondary | ICD-10-CM | POA: Diagnosis not present

## 2015-10-20 DIAGNOSIS — M7062 Trochanteric bursitis, left hip: Secondary | ICD-10-CM | POA: Diagnosis not present

## 2015-11-04 DIAGNOSIS — R69 Illness, unspecified: Secondary | ICD-10-CM | POA: Diagnosis not present

## 2015-11-04 DIAGNOSIS — D485 Neoplasm of uncertain behavior of skin: Secondary | ICD-10-CM | POA: Diagnosis not present

## 2015-11-04 DIAGNOSIS — M86071 Acute hematogenous osteomyelitis, right ankle and foot: Secondary | ICD-10-CM | POA: Diagnosis not present

## 2015-11-04 DIAGNOSIS — D225 Melanocytic nevi of trunk: Secondary | ICD-10-CM | POA: Diagnosis not present

## 2015-11-04 DIAGNOSIS — I129 Hypertensive chronic kidney disease with stage 1 through stage 4 chronic kidney disease, or unspecified chronic kidney disease: Secondary | ICD-10-CM | POA: Diagnosis not present

## 2015-11-11 DIAGNOSIS — J441 Chronic obstructive pulmonary disease with (acute) exacerbation: Secondary | ICD-10-CM | POA: Diagnosis not present

## 2015-12-11 DIAGNOSIS — J441 Chronic obstructive pulmonary disease with (acute) exacerbation: Secondary | ICD-10-CM | POA: Diagnosis not present

## 2015-12-12 DIAGNOSIS — R69 Illness, unspecified: Secondary | ICD-10-CM | POA: Diagnosis not present

## 2015-12-22 DIAGNOSIS — M175 Other unilateral secondary osteoarthritis of knee: Secondary | ICD-10-CM | POA: Diagnosis not present

## 2015-12-22 DIAGNOSIS — I10 Essential (primary) hypertension: Secondary | ICD-10-CM | POA: Diagnosis not present

## 2015-12-22 DIAGNOSIS — Z6831 Body mass index (BMI) 31.0-31.9, adult: Secondary | ICD-10-CM | POA: Diagnosis not present

## 2015-12-22 DIAGNOSIS — N181 Chronic kidney disease, stage 1: Secondary | ICD-10-CM | POA: Diagnosis not present

## 2015-12-22 DIAGNOSIS — J440 Chronic obstructive pulmonary disease with acute lower respiratory infection: Secondary | ICD-10-CM | POA: Diagnosis not present

## 2015-12-22 DIAGNOSIS — M545 Low back pain: Secondary | ICD-10-CM | POA: Diagnosis not present

## 2016-01-11 DIAGNOSIS — J441 Chronic obstructive pulmonary disease with (acute) exacerbation: Secondary | ICD-10-CM | POA: Diagnosis not present

## 2016-01-12 DIAGNOSIS — R69 Illness, unspecified: Secondary | ICD-10-CM | POA: Diagnosis not present

## 2016-02-05 DIAGNOSIS — I509 Heart failure, unspecified: Secondary | ICD-10-CM | POA: Diagnosis not present

## 2016-02-05 DIAGNOSIS — H401131 Primary open-angle glaucoma, bilateral, mild stage: Secondary | ICD-10-CM | POA: Diagnosis not present

## 2016-02-11 DIAGNOSIS — J441 Chronic obstructive pulmonary disease with (acute) exacerbation: Secondary | ICD-10-CM | POA: Diagnosis not present

## 2016-02-17 DIAGNOSIS — R69 Illness, unspecified: Secondary | ICD-10-CM | POA: Diagnosis not present

## 2016-03-12 DIAGNOSIS — J441 Chronic obstructive pulmonary disease with (acute) exacerbation: Secondary | ICD-10-CM | POA: Diagnosis not present

## 2016-03-17 DIAGNOSIS — R69 Illness, unspecified: Secondary | ICD-10-CM | POA: Diagnosis not present

## 2016-03-23 DIAGNOSIS — Z6830 Body mass index (BMI) 30.0-30.9, adult: Secondary | ICD-10-CM | POA: Diagnosis not present

## 2016-03-23 DIAGNOSIS — N181 Chronic kidney disease, stage 1: Secondary | ICD-10-CM | POA: Diagnosis not present

## 2016-03-23 DIAGNOSIS — M175 Other unilateral secondary osteoarthritis of knee: Secondary | ICD-10-CM | POA: Diagnosis not present

## 2016-03-23 DIAGNOSIS — M545 Low back pain: Secondary | ICD-10-CM | POA: Diagnosis not present

## 2016-03-23 DIAGNOSIS — I10 Essential (primary) hypertension: Secondary | ICD-10-CM | POA: Diagnosis not present

## 2016-03-23 DIAGNOSIS — J440 Chronic obstructive pulmonary disease with acute lower respiratory infection: Secondary | ICD-10-CM | POA: Diagnosis not present

## 2016-03-29 DIAGNOSIS — R69 Illness, unspecified: Secondary | ICD-10-CM | POA: Diagnosis not present

## 2016-03-30 DIAGNOSIS — Z8701 Personal history of pneumonia (recurrent): Secondary | ICD-10-CM | POA: Diagnosis not present

## 2016-03-30 DIAGNOSIS — R0689 Other abnormalities of breathing: Secondary | ICD-10-CM | POA: Diagnosis not present

## 2016-03-30 DIAGNOSIS — J449 Chronic obstructive pulmonary disease, unspecified: Secondary | ICD-10-CM | POA: Diagnosis not present

## 2016-03-30 DIAGNOSIS — Z87891 Personal history of nicotine dependence: Secondary | ICD-10-CM | POA: Diagnosis not present

## 2016-03-30 DIAGNOSIS — R0602 Shortness of breath: Secondary | ICD-10-CM | POA: Diagnosis not present

## 2016-04-06 DIAGNOSIS — I509 Heart failure, unspecified: Secondary | ICD-10-CM | POA: Diagnosis not present

## 2016-04-12 DIAGNOSIS — J441 Chronic obstructive pulmonary disease with (acute) exacerbation: Secondary | ICD-10-CM | POA: Diagnosis not present

## 2016-05-12 DIAGNOSIS — J441 Chronic obstructive pulmonary disease with (acute) exacerbation: Secondary | ICD-10-CM | POA: Diagnosis not present

## 2016-06-02 DIAGNOSIS — I10 Essential (primary) hypertension: Secondary | ICD-10-CM | POA: Diagnosis not present

## 2016-06-02 DIAGNOSIS — J441 Chronic obstructive pulmonary disease with (acute) exacerbation: Secondary | ICD-10-CM | POA: Diagnosis not present

## 2016-06-08 DIAGNOSIS — H401131 Primary open-angle glaucoma, bilateral, mild stage: Secondary | ICD-10-CM | POA: Diagnosis not present

## 2016-06-12 DIAGNOSIS — J441 Chronic obstructive pulmonary disease with (acute) exacerbation: Secondary | ICD-10-CM | POA: Diagnosis not present

## 2016-06-18 DIAGNOSIS — I10 Essential (primary) hypertension: Secondary | ICD-10-CM | POA: Diagnosis not present

## 2016-06-18 DIAGNOSIS — J441 Chronic obstructive pulmonary disease with (acute) exacerbation: Secondary | ICD-10-CM | POA: Diagnosis not present

## 2016-06-22 DIAGNOSIS — M545 Low back pain: Secondary | ICD-10-CM | POA: Diagnosis not present

## 2016-06-22 DIAGNOSIS — Z6829 Body mass index (BMI) 29.0-29.9, adult: Secondary | ICD-10-CM | POA: Diagnosis not present

## 2016-06-22 DIAGNOSIS — J440 Chronic obstructive pulmonary disease with acute lower respiratory infection: Secondary | ICD-10-CM | POA: Diagnosis not present

## 2016-06-22 DIAGNOSIS — M175 Other unilateral secondary osteoarthritis of knee: Secondary | ICD-10-CM | POA: Diagnosis not present

## 2016-06-22 DIAGNOSIS — N181 Chronic kidney disease, stage 1: Secondary | ICD-10-CM | POA: Diagnosis not present

## 2016-06-22 DIAGNOSIS — I10 Essential (primary) hypertension: Secondary | ICD-10-CM | POA: Diagnosis not present

## 2016-07-13 DIAGNOSIS — J441 Chronic obstructive pulmonary disease with (acute) exacerbation: Secondary | ICD-10-CM | POA: Diagnosis not present

## 2016-07-16 DIAGNOSIS — I10 Essential (primary) hypertension: Secondary | ICD-10-CM | POA: Diagnosis not present

## 2016-07-16 DIAGNOSIS — M545 Low back pain: Secondary | ICD-10-CM | POA: Diagnosis not present

## 2016-07-16 DIAGNOSIS — J440 Chronic obstructive pulmonary disease with acute lower respiratory infection: Secondary | ICD-10-CM | POA: Diagnosis not present

## 2016-07-16 DIAGNOSIS — M175 Other unilateral secondary osteoarthritis of knee: Secondary | ICD-10-CM | POA: Diagnosis not present

## 2016-07-16 DIAGNOSIS — N181 Chronic kidney disease, stage 1: Secondary | ICD-10-CM | POA: Diagnosis not present

## 2016-07-23 DIAGNOSIS — I110 Hypertensive heart disease with heart failure: Secondary | ICD-10-CM | POA: Diagnosis not present

## 2016-07-23 DIAGNOSIS — R0602 Shortness of breath: Secondary | ICD-10-CM | POA: Diagnosis not present

## 2016-07-23 DIAGNOSIS — R05 Cough: Secondary | ICD-10-CM | POA: Diagnosis not present

## 2016-07-23 DIAGNOSIS — J441 Chronic obstructive pulmonary disease with (acute) exacerbation: Secondary | ICD-10-CM | POA: Diagnosis not present

## 2016-07-23 DIAGNOSIS — I5032 Chronic diastolic (congestive) heart failure: Secondary | ICD-10-CM | POA: Diagnosis not present

## 2016-07-23 DIAGNOSIS — J9611 Chronic respiratory failure with hypoxia: Secondary | ICD-10-CM | POA: Diagnosis not present

## 2016-07-24 DIAGNOSIS — J441 Chronic obstructive pulmonary disease with (acute) exacerbation: Secondary | ICD-10-CM | POA: Diagnosis not present

## 2016-07-24 DIAGNOSIS — I5032 Chronic diastolic (congestive) heart failure: Secondary | ICD-10-CM | POA: Diagnosis not present

## 2016-07-24 DIAGNOSIS — I110 Hypertensive heart disease with heart failure: Secondary | ICD-10-CM | POA: Diagnosis not present

## 2016-07-24 DIAGNOSIS — J9611 Chronic respiratory failure with hypoxia: Secondary | ICD-10-CM | POA: Diagnosis not present

## 2016-07-25 DIAGNOSIS — I110 Hypertensive heart disease with heart failure: Secondary | ICD-10-CM | POA: Diagnosis not present

## 2016-07-25 DIAGNOSIS — Z79899 Other long term (current) drug therapy: Secondary | ICD-10-CM | POA: Diagnosis not present

## 2016-07-25 DIAGNOSIS — I5032 Chronic diastolic (congestive) heart failure: Secondary | ICD-10-CM | POA: Diagnosis not present

## 2016-07-25 DIAGNOSIS — J441 Chronic obstructive pulmonary disease with (acute) exacerbation: Secondary | ICD-10-CM | POA: Diagnosis not present

## 2016-07-25 DIAGNOSIS — M1990 Unspecified osteoarthritis, unspecified site: Secondary | ICD-10-CM | POA: Diagnosis not present

## 2016-07-25 DIAGNOSIS — J9611 Chronic respiratory failure with hypoxia: Secondary | ICD-10-CM | POA: Diagnosis not present

## 2016-07-25 DIAGNOSIS — M479 Spondylosis, unspecified: Secondary | ICD-10-CM | POA: Diagnosis not present

## 2016-07-25 DIAGNOSIS — Z780 Asymptomatic menopausal state: Secondary | ICD-10-CM | POA: Diagnosis not present

## 2016-08-06 DIAGNOSIS — Z6829 Body mass index (BMI) 29.0-29.9, adult: Secondary | ICD-10-CM | POA: Diagnosis not present

## 2016-08-06 DIAGNOSIS — J440 Chronic obstructive pulmonary disease with acute lower respiratory infection: Secondary | ICD-10-CM | POA: Diagnosis not present

## 2016-08-10 DIAGNOSIS — J441 Chronic obstructive pulmonary disease with (acute) exacerbation: Secondary | ICD-10-CM | POA: Diagnosis not present

## 2016-09-10 DIAGNOSIS — J441 Chronic obstructive pulmonary disease with (acute) exacerbation: Secondary | ICD-10-CM | POA: Diagnosis not present

## 2016-09-20 DIAGNOSIS — Z6828 Body mass index (BMI) 28.0-28.9, adult: Secondary | ICD-10-CM | POA: Diagnosis not present

## 2016-09-20 DIAGNOSIS — J440 Chronic obstructive pulmonary disease with acute lower respiratory infection: Secondary | ICD-10-CM | POA: Diagnosis not present

## 2016-09-28 DIAGNOSIS — R0602 Shortness of breath: Secondary | ICD-10-CM | POA: Diagnosis not present

## 2016-09-28 DIAGNOSIS — Z8701 Personal history of pneumonia (recurrent): Secondary | ICD-10-CM | POA: Diagnosis not present

## 2016-09-28 DIAGNOSIS — R0689 Other abnormalities of breathing: Secondary | ICD-10-CM | POA: Diagnosis not present

## 2016-09-28 DIAGNOSIS — Z87891 Personal history of nicotine dependence: Secondary | ICD-10-CM | POA: Diagnosis not present

## 2016-09-28 DIAGNOSIS — J449 Chronic obstructive pulmonary disease, unspecified: Secondary | ICD-10-CM | POA: Diagnosis not present

## 2016-09-29 DIAGNOSIS — I10 Essential (primary) hypertension: Secondary | ICD-10-CM | POA: Diagnosis not present

## 2016-09-29 DIAGNOSIS — J441 Chronic obstructive pulmonary disease with (acute) exacerbation: Secondary | ICD-10-CM | POA: Diagnosis not present

## 2016-10-07 DIAGNOSIS — H5203 Hypermetropia, bilateral: Secondary | ICD-10-CM | POA: Diagnosis not present

## 2016-10-07 DIAGNOSIS — Z961 Presence of intraocular lens: Secondary | ICD-10-CM | POA: Diagnosis not present

## 2016-10-07 DIAGNOSIS — H353131 Nonexudative age-related macular degeneration, bilateral, early dry stage: Secondary | ICD-10-CM | POA: Diagnosis not present

## 2016-10-07 DIAGNOSIS — H401131 Primary open-angle glaucoma, bilateral, mild stage: Secondary | ICD-10-CM | POA: Diagnosis not present

## 2016-10-10 DIAGNOSIS — J441 Chronic obstructive pulmonary disease with (acute) exacerbation: Secondary | ICD-10-CM | POA: Diagnosis not present

## 2016-10-27 DIAGNOSIS — H409 Unspecified glaucoma: Secondary | ICD-10-CM | POA: Diagnosis not present

## 2016-10-27 DIAGNOSIS — I509 Heart failure, unspecified: Secondary | ICD-10-CM | POA: Diagnosis not present

## 2016-10-27 DIAGNOSIS — Z0000 Encounter for general adult medical examination without abnormal findings: Secondary | ICD-10-CM | POA: Diagnosis not present

## 2016-10-27 DIAGNOSIS — J449 Chronic obstructive pulmonary disease, unspecified: Secondary | ICD-10-CM | POA: Diagnosis not present

## 2016-10-27 DIAGNOSIS — Z87891 Personal history of nicotine dependence: Secondary | ICD-10-CM | POA: Diagnosis not present

## 2016-10-27 DIAGNOSIS — Z791 Long term (current) use of non-steroidal anti-inflammatories (NSAID): Secondary | ICD-10-CM | POA: Diagnosis not present

## 2016-10-27 DIAGNOSIS — M13111 Monoarthritis, not elsewhere classified, right shoulder: Secondary | ICD-10-CM | POA: Diagnosis not present

## 2016-10-27 DIAGNOSIS — M62830 Muscle spasm of back: Secondary | ICD-10-CM | POA: Diagnosis not present

## 2016-10-27 DIAGNOSIS — Z79891 Long term (current) use of opiate analgesic: Secondary | ICD-10-CM | POA: Diagnosis not present

## 2016-10-27 DIAGNOSIS — Z7951 Long term (current) use of inhaled steroids: Secondary | ICD-10-CM | POA: Diagnosis not present

## 2016-10-27 DIAGNOSIS — I110 Hypertensive heart disease with heart failure: Secondary | ICD-10-CM | POA: Diagnosis not present

## 2016-10-27 DIAGNOSIS — Z9981 Dependence on supplemental oxygen: Secondary | ICD-10-CM | POA: Diagnosis not present

## 2016-10-27 DIAGNOSIS — M47816 Spondylosis without myelopathy or radiculopathy, lumbar region: Secondary | ICD-10-CM | POA: Diagnosis not present

## 2016-10-27 DIAGNOSIS — Z79899 Other long term (current) drug therapy: Secondary | ICD-10-CM | POA: Diagnosis not present

## 2016-10-27 DIAGNOSIS — M13112 Monoarthritis, not elsewhere classified, left shoulder: Secondary | ICD-10-CM | POA: Diagnosis not present

## 2016-10-27 DIAGNOSIS — E559 Vitamin D deficiency, unspecified: Secondary | ICD-10-CM | POA: Diagnosis not present

## 2016-10-29 DIAGNOSIS — J441 Chronic obstructive pulmonary disease with (acute) exacerbation: Secondary | ICD-10-CM | POA: Diagnosis not present

## 2016-10-29 DIAGNOSIS — I10 Essential (primary) hypertension: Secondary | ICD-10-CM | POA: Diagnosis not present

## 2016-11-10 DIAGNOSIS — J441 Chronic obstructive pulmonary disease with (acute) exacerbation: Secondary | ICD-10-CM | POA: Diagnosis not present

## 2016-11-16 DIAGNOSIS — R69 Illness, unspecified: Secondary | ICD-10-CM | POA: Diagnosis not present

## 2016-11-22 DIAGNOSIS — R69 Illness, unspecified: Secondary | ICD-10-CM | POA: Diagnosis not present

## 2016-12-02 DIAGNOSIS — I509 Heart failure, unspecified: Secondary | ICD-10-CM | POA: Diagnosis not present

## 2016-12-03 DIAGNOSIS — R69 Illness, unspecified: Secondary | ICD-10-CM | POA: Diagnosis not present

## 2016-12-10 DIAGNOSIS — J441 Chronic obstructive pulmonary disease with (acute) exacerbation: Secondary | ICD-10-CM | POA: Diagnosis not present

## 2016-12-18 DIAGNOSIS — R69 Illness, unspecified: Secondary | ICD-10-CM | POA: Diagnosis not present

## 2016-12-31 DIAGNOSIS — Z6828 Body mass index (BMI) 28.0-28.9, adult: Secondary | ICD-10-CM | POA: Diagnosis not present

## 2016-12-31 DIAGNOSIS — J440 Chronic obstructive pulmonary disease with acute lower respiratory infection: Secondary | ICD-10-CM | POA: Diagnosis not present

## 2016-12-31 DIAGNOSIS — I5032 Chronic diastolic (congestive) heart failure: Secondary | ICD-10-CM | POA: Diagnosis not present

## 2017-01-05 DIAGNOSIS — Z131 Encounter for screening for diabetes mellitus: Secondary | ICD-10-CM | POA: Diagnosis not present

## 2017-01-05 DIAGNOSIS — I10 Essential (primary) hypertension: Secondary | ICD-10-CM | POA: Diagnosis not present

## 2017-01-05 DIAGNOSIS — N181 Chronic kidney disease, stage 1: Secondary | ICD-10-CM | POA: Diagnosis not present

## 2017-01-10 DIAGNOSIS — J441 Chronic obstructive pulmonary disease with (acute) exacerbation: Secondary | ICD-10-CM | POA: Diagnosis not present

## 2017-02-10 DIAGNOSIS — J441 Chronic obstructive pulmonary disease with (acute) exacerbation: Secondary | ICD-10-CM | POA: Diagnosis not present

## 2017-02-14 DIAGNOSIS — Z961 Presence of intraocular lens: Secondary | ICD-10-CM | POA: Diagnosis not present

## 2017-02-14 DIAGNOSIS — J441 Chronic obstructive pulmonary disease with (acute) exacerbation: Secondary | ICD-10-CM | POA: Diagnosis not present

## 2017-02-14 DIAGNOSIS — H401131 Primary open-angle glaucoma, bilateral, mild stage: Secondary | ICD-10-CM | POA: Diagnosis not present

## 2017-02-14 DIAGNOSIS — I10 Essential (primary) hypertension: Secondary | ICD-10-CM | POA: Diagnosis not present

## 2017-03-03 DIAGNOSIS — Z0000 Encounter for general adult medical examination without abnormal findings: Secondary | ICD-10-CM | POA: Diagnosis not present

## 2017-03-03 DIAGNOSIS — Z1231 Encounter for screening mammogram for malignant neoplasm of breast: Secondary | ICD-10-CM | POA: Diagnosis not present

## 2017-03-12 DIAGNOSIS — J441 Chronic obstructive pulmonary disease with (acute) exacerbation: Secondary | ICD-10-CM | POA: Diagnosis not present

## 2017-03-29 DIAGNOSIS — Z87891 Personal history of nicotine dependence: Secondary | ICD-10-CM | POA: Diagnosis not present

## 2017-03-29 DIAGNOSIS — R0689 Other abnormalities of breathing: Secondary | ICD-10-CM | POA: Diagnosis not present

## 2017-03-29 DIAGNOSIS — Z8701 Personal history of pneumonia (recurrent): Secondary | ICD-10-CM | POA: Diagnosis not present

## 2017-03-29 DIAGNOSIS — R0602 Shortness of breath: Secondary | ICD-10-CM | POA: Diagnosis not present

## 2017-03-29 DIAGNOSIS — J449 Chronic obstructive pulmonary disease, unspecified: Secondary | ICD-10-CM | POA: Diagnosis not present

## 2017-04-12 DIAGNOSIS — I10 Essential (primary) hypertension: Secondary | ICD-10-CM | POA: Diagnosis not present

## 2017-04-12 DIAGNOSIS — R3981 Functional urinary incontinence: Secondary | ICD-10-CM | POA: Diagnosis not present

## 2017-04-12 DIAGNOSIS — N3941 Urge incontinence: Secondary | ICD-10-CM | POA: Diagnosis not present

## 2017-04-12 DIAGNOSIS — J441 Chronic obstructive pulmonary disease with (acute) exacerbation: Secondary | ICD-10-CM | POA: Diagnosis not present

## 2017-04-18 DIAGNOSIS — Z1389 Encounter for screening for other disorder: Secondary | ICD-10-CM | POA: Diagnosis not present

## 2017-04-18 DIAGNOSIS — M545 Low back pain: Secondary | ICD-10-CM | POA: Diagnosis not present

## 2017-04-18 DIAGNOSIS — J441 Chronic obstructive pulmonary disease with (acute) exacerbation: Secondary | ICD-10-CM | POA: Diagnosis not present

## 2017-04-18 DIAGNOSIS — Z6829 Body mass index (BMI) 29.0-29.9, adult: Secondary | ICD-10-CM | POA: Diagnosis not present

## 2017-04-18 DIAGNOSIS — Z0000 Encounter for general adult medical examination without abnormal findings: Secondary | ICD-10-CM | POA: Diagnosis not present

## 2017-04-18 DIAGNOSIS — I10 Essential (primary) hypertension: Secondary | ICD-10-CM | POA: Diagnosis not present

## 2017-04-18 DIAGNOSIS — N181 Chronic kidney disease, stage 1: Secondary | ICD-10-CM | POA: Diagnosis not present

## 2017-04-18 DIAGNOSIS — I5032 Chronic diastolic (congestive) heart failure: Secondary | ICD-10-CM | POA: Diagnosis not present

## 2017-04-26 DIAGNOSIS — I10 Essential (primary) hypertension: Secondary | ICD-10-CM | POA: Diagnosis not present

## 2017-05-02 DIAGNOSIS — R0689 Other abnormalities of breathing: Secondary | ICD-10-CM | POA: Diagnosis not present

## 2017-05-02 DIAGNOSIS — Z8701 Personal history of pneumonia (recurrent): Secondary | ICD-10-CM | POA: Diagnosis not present

## 2017-05-02 DIAGNOSIS — J449 Chronic obstructive pulmonary disease, unspecified: Secondary | ICD-10-CM | POA: Diagnosis not present

## 2017-05-02 DIAGNOSIS — Z87891 Personal history of nicotine dependence: Secondary | ICD-10-CM | POA: Diagnosis not present

## 2017-05-02 DIAGNOSIS — R0602 Shortness of breath: Secondary | ICD-10-CM | POA: Diagnosis not present

## 2017-05-12 DIAGNOSIS — J441 Chronic obstructive pulmonary disease with (acute) exacerbation: Secondary | ICD-10-CM | POA: Diagnosis not present

## 2017-05-12 DIAGNOSIS — I10 Essential (primary) hypertension: Secondary | ICD-10-CM | POA: Diagnosis not present

## 2017-06-12 DIAGNOSIS — J441 Chronic obstructive pulmonary disease with (acute) exacerbation: Secondary | ICD-10-CM | POA: Diagnosis not present

## 2017-06-16 DIAGNOSIS — H401131 Primary open-angle glaucoma, bilateral, mild stage: Secondary | ICD-10-CM | POA: Diagnosis not present

## 2017-06-16 DIAGNOSIS — Z961 Presence of intraocular lens: Secondary | ICD-10-CM | POA: Diagnosis not present

## 2017-06-17 DIAGNOSIS — R69 Illness, unspecified: Secondary | ICD-10-CM | POA: Diagnosis not present

## 2017-07-06 DIAGNOSIS — I509 Heart failure, unspecified: Secondary | ICD-10-CM | POA: Diagnosis not present

## 2017-07-13 DIAGNOSIS — J441 Chronic obstructive pulmonary disease with (acute) exacerbation: Secondary | ICD-10-CM | POA: Diagnosis not present

## 2017-07-18 DIAGNOSIS — N181 Chronic kidney disease, stage 1: Secondary | ICD-10-CM | POA: Diagnosis not present

## 2017-07-18 DIAGNOSIS — I5032 Chronic diastolic (congestive) heart failure: Secondary | ICD-10-CM | POA: Diagnosis not present

## 2017-07-18 DIAGNOSIS — I10 Essential (primary) hypertension: Secondary | ICD-10-CM | POA: Diagnosis not present

## 2017-07-18 DIAGNOSIS — M545 Low back pain: Secondary | ICD-10-CM | POA: Diagnosis not present

## 2017-07-18 DIAGNOSIS — Z6829 Body mass index (BMI) 29.0-29.9, adult: Secondary | ICD-10-CM | POA: Diagnosis not present

## 2017-07-18 DIAGNOSIS — J441 Chronic obstructive pulmonary disease with (acute) exacerbation: Secondary | ICD-10-CM | POA: Diagnosis not present

## 2017-07-18 DIAGNOSIS — Z1389 Encounter for screening for other disorder: Secondary | ICD-10-CM | POA: Diagnosis not present

## 2017-07-18 DIAGNOSIS — Z0000 Encounter for general adult medical examination without abnormal findings: Secondary | ICD-10-CM | POA: Diagnosis not present

## 2017-07-26 DIAGNOSIS — K08409 Partial loss of teeth, unspecified cause, unspecified class: Secondary | ICD-10-CM | POA: Diagnosis not present

## 2017-07-26 DIAGNOSIS — H409 Unspecified glaucoma: Secondary | ICD-10-CM | POA: Diagnosis not present

## 2017-07-26 DIAGNOSIS — I499 Cardiac arrhythmia, unspecified: Secondary | ICD-10-CM | POA: Diagnosis not present

## 2017-07-26 DIAGNOSIS — J309 Allergic rhinitis, unspecified: Secondary | ICD-10-CM | POA: Diagnosis not present

## 2017-07-26 DIAGNOSIS — I110 Hypertensive heart disease with heart failure: Secondary | ICD-10-CM | POA: Diagnosis not present

## 2017-07-26 DIAGNOSIS — I509 Heart failure, unspecified: Secondary | ICD-10-CM | POA: Diagnosis not present

## 2017-07-26 DIAGNOSIS — G8929 Other chronic pain: Secondary | ICD-10-CM | POA: Diagnosis not present

## 2017-07-26 DIAGNOSIS — J9611 Chronic respiratory failure with hypoxia: Secondary | ICD-10-CM | POA: Diagnosis not present

## 2017-07-26 DIAGNOSIS — K5900 Constipation, unspecified: Secondary | ICD-10-CM | POA: Diagnosis not present

## 2017-07-26 DIAGNOSIS — J449 Chronic obstructive pulmonary disease, unspecified: Secondary | ICD-10-CM | POA: Diagnosis not present

## 2017-07-28 DIAGNOSIS — H401131 Primary open-angle glaucoma, bilateral, mild stage: Secondary | ICD-10-CM | POA: Diagnosis not present

## 2017-08-03 DIAGNOSIS — M779 Enthesopathy, unspecified: Secondary | ICD-10-CM | POA: Diagnosis not present

## 2017-08-03 DIAGNOSIS — Z6830 Body mass index (BMI) 30.0-30.9, adult: Secondary | ICD-10-CM | POA: Diagnosis not present

## 2017-08-08 DIAGNOSIS — I10 Essential (primary) hypertension: Secondary | ICD-10-CM | POA: Diagnosis not present

## 2017-08-08 DIAGNOSIS — J441 Chronic obstructive pulmonary disease with (acute) exacerbation: Secondary | ICD-10-CM | POA: Diagnosis not present

## 2017-08-10 DIAGNOSIS — J441 Chronic obstructive pulmonary disease with (acute) exacerbation: Secondary | ICD-10-CM | POA: Diagnosis not present

## 2017-08-21 DIAGNOSIS — R3981 Functional urinary incontinence: Secondary | ICD-10-CM | POA: Diagnosis not present

## 2017-08-21 DIAGNOSIS — N3941 Urge incontinence: Secondary | ICD-10-CM | POA: Diagnosis not present

## 2017-08-21 DIAGNOSIS — I10 Essential (primary) hypertension: Secondary | ICD-10-CM | POA: Diagnosis not present

## 2017-09-10 DIAGNOSIS — J441 Chronic obstructive pulmonary disease with (acute) exacerbation: Secondary | ICD-10-CM | POA: Diagnosis not present

## 2017-10-10 DIAGNOSIS — J441 Chronic obstructive pulmonary disease with (acute) exacerbation: Secondary | ICD-10-CM | POA: Diagnosis not present

## 2017-10-20 DIAGNOSIS — M79641 Pain in right hand: Secondary | ICD-10-CM | POA: Diagnosis not present

## 2017-10-20 DIAGNOSIS — M545 Low back pain: Secondary | ICD-10-CM | POA: Diagnosis not present

## 2017-10-20 DIAGNOSIS — Z6830 Body mass index (BMI) 30.0-30.9, adult: Secondary | ICD-10-CM | POA: Diagnosis not present

## 2017-10-20 DIAGNOSIS — I5042 Chronic combined systolic (congestive) and diastolic (congestive) heart failure: Secondary | ICD-10-CM | POA: Diagnosis not present

## 2017-10-20 DIAGNOSIS — R69 Illness, unspecified: Secondary | ICD-10-CM | POA: Diagnosis not present

## 2017-10-20 DIAGNOSIS — I10 Essential (primary) hypertension: Secondary | ICD-10-CM | POA: Diagnosis not present

## 2017-10-20 DIAGNOSIS — J440 Chronic obstructive pulmonary disease with acute lower respiratory infection: Secondary | ICD-10-CM | POA: Diagnosis not present

## 2017-11-01 DIAGNOSIS — Z8701 Personal history of pneumonia (recurrent): Secondary | ICD-10-CM | POA: Diagnosis not present

## 2017-11-01 DIAGNOSIS — Z87891 Personal history of nicotine dependence: Secondary | ICD-10-CM | POA: Diagnosis not present

## 2017-11-01 DIAGNOSIS — R0602 Shortness of breath: Secondary | ICD-10-CM | POA: Diagnosis not present

## 2017-11-01 DIAGNOSIS — R0689 Other abnormalities of breathing: Secondary | ICD-10-CM | POA: Diagnosis not present

## 2017-11-01 DIAGNOSIS — J449 Chronic obstructive pulmonary disease, unspecified: Secondary | ICD-10-CM | POA: Diagnosis not present

## 2017-11-10 DIAGNOSIS — J441 Chronic obstructive pulmonary disease with (acute) exacerbation: Secondary | ICD-10-CM | POA: Diagnosis not present

## 2017-12-10 DIAGNOSIS — J441 Chronic obstructive pulmonary disease with (acute) exacerbation: Secondary | ICD-10-CM | POA: Diagnosis not present

## 2017-12-14 DIAGNOSIS — H401131 Primary open-angle glaucoma, bilateral, mild stage: Secondary | ICD-10-CM | POA: Diagnosis not present

## 2017-12-14 DIAGNOSIS — H5203 Hypermetropia, bilateral: Secondary | ICD-10-CM | POA: Diagnosis not present

## 2017-12-14 DIAGNOSIS — H353131 Nonexudative age-related macular degeneration, bilateral, early dry stage: Secondary | ICD-10-CM | POA: Diagnosis not present

## 2017-12-14 DIAGNOSIS — Z961 Presence of intraocular lens: Secondary | ICD-10-CM | POA: Diagnosis not present

## 2017-12-27 DIAGNOSIS — Z6829 Body mass index (BMI) 29.0-29.9, adult: Secondary | ICD-10-CM | POA: Diagnosis not present

## 2017-12-27 DIAGNOSIS — M79641 Pain in right hand: Secondary | ICD-10-CM | POA: Diagnosis not present

## 2017-12-27 DIAGNOSIS — M1049 Other secondary gout, multiple sites: Secondary | ICD-10-CM | POA: Diagnosis not present

## 2018-01-10 DIAGNOSIS — J441 Chronic obstructive pulmonary disease with (acute) exacerbation: Secondary | ICD-10-CM | POA: Diagnosis not present

## 2018-01-25 DIAGNOSIS — H401131 Primary open-angle glaucoma, bilateral, mild stage: Secondary | ICD-10-CM | POA: Diagnosis not present

## 2018-01-30 DIAGNOSIS — Z6829 Body mass index (BMI) 29.0-29.9, adult: Secondary | ICD-10-CM | POA: Diagnosis not present

## 2018-01-30 DIAGNOSIS — N182 Chronic kidney disease, stage 2 (mild): Secondary | ICD-10-CM | POA: Diagnosis not present

## 2018-01-30 DIAGNOSIS — M1049 Other secondary gout, multiple sites: Secondary | ICD-10-CM | POA: Diagnosis not present

## 2018-01-30 DIAGNOSIS — M79641 Pain in right hand: Secondary | ICD-10-CM | POA: Diagnosis not present

## 2018-01-30 DIAGNOSIS — I10 Essential (primary) hypertension: Secondary | ICD-10-CM | POA: Diagnosis not present

## 2018-01-30 DIAGNOSIS — J449 Chronic obstructive pulmonary disease, unspecified: Secondary | ICD-10-CM | POA: Diagnosis not present

## 2018-02-03 DIAGNOSIS — I509 Heart failure, unspecified: Secondary | ICD-10-CM | POA: Diagnosis not present

## 2018-02-10 DIAGNOSIS — Z6829 Body mass index (BMI) 29.0-29.9, adult: Secondary | ICD-10-CM | POA: Diagnosis not present

## 2018-02-10 DIAGNOSIS — S300XXS Contusion of lower back and pelvis, sequela: Secondary | ICD-10-CM | POA: Diagnosis not present

## 2018-02-10 DIAGNOSIS — J441 Chronic obstructive pulmonary disease with (acute) exacerbation: Secondary | ICD-10-CM | POA: Diagnosis not present

## 2018-02-13 DIAGNOSIS — M5136 Other intervertebral disc degeneration, lumbar region: Secondary | ICD-10-CM | POA: Diagnosis not present

## 2018-02-13 DIAGNOSIS — M545 Low back pain: Secondary | ICD-10-CM | POA: Diagnosis not present

## 2018-03-12 DIAGNOSIS — J441 Chronic obstructive pulmonary disease with (acute) exacerbation: Secondary | ICD-10-CM | POA: Diagnosis not present

## 2018-04-12 DIAGNOSIS — J441 Chronic obstructive pulmonary disease with (acute) exacerbation: Secondary | ICD-10-CM | POA: Diagnosis not present

## 2018-04-18 DIAGNOSIS — R69 Illness, unspecified: Secondary | ICD-10-CM | POA: Diagnosis not present

## 2018-05-02 DIAGNOSIS — J449 Chronic obstructive pulmonary disease, unspecified: Secondary | ICD-10-CM | POA: Diagnosis not present

## 2018-05-02 DIAGNOSIS — Z8701 Personal history of pneumonia (recurrent): Secondary | ICD-10-CM | POA: Diagnosis not present

## 2018-05-02 DIAGNOSIS — Z87891 Personal history of nicotine dependence: Secondary | ICD-10-CM | POA: Diagnosis not present

## 2018-05-02 DIAGNOSIS — R0602 Shortness of breath: Secondary | ICD-10-CM | POA: Diagnosis not present

## 2018-05-12 DIAGNOSIS — R69 Illness, unspecified: Secondary | ICD-10-CM | POA: Diagnosis not present

## 2018-05-12 DIAGNOSIS — Z0000 Encounter for general adult medical examination without abnormal findings: Secondary | ICD-10-CM | POA: Diagnosis not present

## 2018-05-12 DIAGNOSIS — I10 Essential (primary) hypertension: Secondary | ICD-10-CM | POA: Diagnosis not present

## 2018-05-12 DIAGNOSIS — Z6830 Body mass index (BMI) 30.0-30.9, adult: Secondary | ICD-10-CM | POA: Diagnosis not present

## 2018-05-12 DIAGNOSIS — J441 Chronic obstructive pulmonary disease with (acute) exacerbation: Secondary | ICD-10-CM | POA: Diagnosis not present

## 2018-05-12 DIAGNOSIS — M189 Osteoarthritis of first carpometacarpal joint, unspecified: Secondary | ICD-10-CM | POA: Diagnosis not present

## 2018-05-12 DIAGNOSIS — Z1389 Encounter for screening for other disorder: Secondary | ICD-10-CM | POA: Diagnosis not present

## 2018-06-12 DIAGNOSIS — J441 Chronic obstructive pulmonary disease with (acute) exacerbation: Secondary | ICD-10-CM | POA: Diagnosis not present

## 2018-06-19 DIAGNOSIS — Z1231 Encounter for screening mammogram for malignant neoplasm of breast: Secondary | ICD-10-CM | POA: Diagnosis not present

## 2018-06-20 DIAGNOSIS — J441 Chronic obstructive pulmonary disease with (acute) exacerbation: Secondary | ICD-10-CM | POA: Diagnosis not present

## 2018-06-20 DIAGNOSIS — M810 Age-related osteoporosis without current pathological fracture: Secondary | ICD-10-CM | POA: Diagnosis not present

## 2018-06-21 DIAGNOSIS — G8929 Other chronic pain: Secondary | ICD-10-CM | POA: Diagnosis not present

## 2018-06-21 DIAGNOSIS — J9611 Chronic respiratory failure with hypoxia: Secondary | ICD-10-CM | POA: Diagnosis not present

## 2018-06-21 DIAGNOSIS — Z791 Long term (current) use of non-steroidal anti-inflammatories (NSAID): Secondary | ICD-10-CM | POA: Diagnosis not present

## 2018-06-21 DIAGNOSIS — I110 Hypertensive heart disease with heart failure: Secondary | ICD-10-CM | POA: Diagnosis not present

## 2018-06-21 DIAGNOSIS — I509 Heart failure, unspecified: Secondary | ICD-10-CM | POA: Diagnosis not present

## 2018-06-21 DIAGNOSIS — M1990 Unspecified osteoarthritis, unspecified site: Secondary | ICD-10-CM | POA: Diagnosis not present

## 2018-06-21 DIAGNOSIS — J309 Allergic rhinitis, unspecified: Secondary | ICD-10-CM | POA: Diagnosis not present

## 2018-06-21 DIAGNOSIS — H409 Unspecified glaucoma: Secondary | ICD-10-CM | POA: Diagnosis not present

## 2018-06-21 DIAGNOSIS — J449 Chronic obstructive pulmonary disease, unspecified: Secondary | ICD-10-CM | POA: Diagnosis not present

## 2018-06-21 DIAGNOSIS — Z7722 Contact with and (suspected) exposure to environmental tobacco smoke (acute) (chronic): Secondary | ICD-10-CM | POA: Diagnosis not present

## 2021-05-05 ENCOUNTER — Other Ambulatory Visit: Payer: Self-pay | Admitting: *Deleted

## 2021-05-05 DIAGNOSIS — K8020 Calculus of gallbladder without cholecystitis without obstruction: Secondary | ICD-10-CM

## 2021-05-19 ENCOUNTER — Encounter: Payer: Self-pay | Admitting: General Surgery

## 2021-05-19 ENCOUNTER — Other Ambulatory Visit: Payer: Self-pay

## 2021-05-19 ENCOUNTER — Ambulatory Visit: Payer: 59 | Admitting: General Surgery

## 2021-05-19 ENCOUNTER — Ambulatory Visit (INDEPENDENT_AMBULATORY_CARE_PROVIDER_SITE_OTHER): Payer: 59 | Admitting: General Surgery

## 2021-05-19 VITALS — BP 110/71 | HR 68 | Temp 98.2°F | Resp 18 | Ht 64.0 in | Wt 153.0 lb

## 2021-05-19 DIAGNOSIS — K8020 Calculus of gallbladder without cholecystitis without obstruction: Secondary | ICD-10-CM | POA: Diagnosis not present

## 2021-05-19 DIAGNOSIS — K5900 Constipation, unspecified: Secondary | ICD-10-CM | POA: Diagnosis not present

## 2021-05-19 DIAGNOSIS — R1084 Generalized abdominal pain: Secondary | ICD-10-CM | POA: Diagnosis not present

## 2021-05-19 NOTE — Progress Notes (Signed)
[facility] History and Physical  Reason for Referral: Gallstones  Referring Physician:  Toma Deiters, MD   Chief Complaint   New Patient (Initial Visit)     Mia Howe is a 80 y.o. female.  HPI: Ms. Detore is an 80 yo who comes in with a history of COPD 2-3L, weight loss of 20 lbs over the last few months, liquid watery stools and then episodes of constipation, who says her biggest issue now is that she does not want to eat and has no appetite. She has no abdominal pain but feels full and bloated. She has a BM and feels better. She says that she can rub her abdomen and have a BM and feel better. In August when she had the [place] she did have some RUQ pain but again this improved with rubbing and BM. She denies any RUQ pain after eating. She has had some constipation at times. The pain would only be in the morning in August, but again there is no pain now. Just continued issues with eating.  She had a colonoscopy in the past but this was years ago and had a polyp removed. Her mother did have colon cancer. She has no rectal bleeding. She has to force herself to eat and feels full quickly.   Past Medical History:  Diagnosis Date   CHF (congestive heart failure) (HCC)    COPD (chronic obstructive pulmonary disease) (HCC)    HTN (hypertension)     Past Surgical History:  Procedure Laterality Date   HERNIA REPAIR     HIP SURGERY     TOTAL ABDOMINAL HYSTERECTOMY      Family History  Problem Relation Age of Onset   Colon cancer Mother    Sarcoidosis Father     Social History   Tobacco Use   Smoking status: Former    Types: Cigarettes   Smokeless tobacco: Never  Substance Use Topics   Alcohol use: Never    Medications: I have reviewed the patient's current medications. Allergies as of 05/19/2021       Reactions   Levofloxacin         Medication List        Accurate as of May 19, 2021 11:59 PM. If you have any questions, ask your nurse or  doctor.          STOP taking these medications    arformoterol 15 MCG/2ML Nebu Commonly known as: BROVANA Stopped by: Lucretia Roers, MD       TAKE these medications    acetaZOLAMIDE 250 MG tablet Commonly known as: DIAMOX SMARTSIG:2 Tablet(s) By Mouth Every Other Day   albuterol 108 (90 Base) MCG/ACT inhaler Commonly known as: VENTOLIN HFA 2P q 4 hrs prn   albuterol (2.5 MG/3ML) 0.083% nebulizer solution Commonly known as: PROVENTIL INHALE ONE vial via NEBULIZER EVERY 6 HOURS AS NEEDED   alendronate 70 MG tablet Commonly known as: FOSAMAX   benazepril 20 MG tablet Commonly known as: LOTENSIN Take by mouth.   Calcium Carb-Cholecalciferol 600-10 MG-MCG Tabs Take 1 tablet by mouth 3 (three) times daily.   escitalopram 10 MG tablet Commonly known as: LEXAPRO Take by mouth.   furosemide 40 MG tablet Commonly known as: LASIX Take by mouth.   HYDROcodone-acetaminophen 7.5-325 MG tablet Commonly known as: NORCO   ipratropium 0.02 % nebulizer solution Commonly known as: ATROVENT INHALE CONTENTS OF 1 VIAL VIA NEBULIZER FOUR TIMES DAILY   latanoprost 0.005 % ophthalmic solution Commonly known  as: XALATAN ADMINISTER 1 DROP INTO BOTH EYES AT BEDTIME   levocetirizine 5 MG tablet Commonly known as: XYZAL   Omega-3 1000 MG Caps Take by mouth.   OXYGEN Inhale into the lungs.   Trelegy Ellipta 100-62.5-25 MCG/ACT Aepb Generic drug: Fluticasone-Umeclidin-Vilant TAKE 1 PUFF ONCE DAILY         ROS:  A comprehensive review of systems was negative except for: Respiratory: positive for wheezing and SOB Musculoskeletal: positive for back pain and joint pain  Blood pressure 110/71, pulse 68, temperature 98.2 F (36.8 C), temperature source Other (Comment), resp. rate 18, height 5\' 4"  (1.626 m), weight 153 lb (69.4 kg), SpO2 96 %. Physical Exam Vitals reviewed.  Constitutional:      Appearance: Normal appearance.  HENT:     Head: Normocephalic.      Nose: Nose normal.  Eyes:     Extraocular Movements: Extraocular movements intact.  Cardiovascular:     Rate and Rhythm: Normal rate and regular rhythm.  Pulmonary:     Effort: Pulmonary effort is normal.     Breath sounds: Normal breath sounds.     Comments: On O2 Abdominal:     General: There is no distension.     Palpations: Abdomen is soft.     Tenderness: There is no abdominal tenderness.  Genitourinary:    Rectum: Tenderness present. No mass or anal fissure.     Comments: Soft stool in vault  Musculoskeletal:        General: No swelling.     Cervical back: Normal range of motion.  Skin:    General: Skin is warm.  Neurological:     General: No focal deficit present.     Mental Status: She is alert and oriented to person, place, and time.  Psychiatric:        Mood and Affect: Mood normal.        Behavior: Behavior normal.        Thought Content: Thought content normal.        Judgment: Judgment normal.    Results:  CLINICAL DATA:  Generalized abdominal pain   EXAM:  ABDOMEN ULTRASOUND COMPLETE   COMPARISON:  None.   FINDINGS:  Gallbladder: Cholelithiasis with largest stone measuring 1.3 cm.  Negative sonographic Murphy sign. No wall thickening or  pericholecystic fluid.   Common bile duct: Diameter: 4.2 mm   Liver: No focal lesion identified. Within normal limits in  parenchymal echogenicity. Portal vein is patent on color Doppler  imaging with normal direction of blood flow towards the liver.   IVC: No abnormality visualized.   Pancreas: Visualized portion unremarkable.   Spleen: Size and appearance within normal limits.   Right Kidney: Length: 9.3 cm. Echogenicity within normal limits. No  mass or hydronephrosis visualized.   Left Kidney: Length: 8.9 cm. Echogenicity within normal limits. No  mass or hydronephrosis visualized.   Abdominal aorta: No aneurysm visualized. There is atherosclerotic  disease.   Other findings: None.   IMPRESSION:   Cholelithiasis.  No evidence of acute cholecystitis.   No other acute sonographic findings.    Electronically Signed    By:    On: 01/13/2021 13:58  Assessment & Plan:  Mia Howe is a 80 y.o. female with gallstones but pain complaints of more constipation, possible diarrhea around an impaction, and no true gallbladder pain. She is more having fullness and early satiety. I am worried about her having something else going on in her stomach, mesenteric  ischemia, constipation/ impaction, or colon cancer given her mothers history.    Do a fleets enema at home, and take metamucil/ or benefiber daily.  Will order a CT scan and refer you to GI. Will call you with CT results.   All questions were answered to the satisfaction of the patient and family.    Lucretia Roers 05/22/2021, 9:37 AM

## 2021-05-19 NOTE — Patient Instructions (Signed)
Do a fleets enema at home, and take metamucil/ or benefiber daily.  Will order a CT scan and refer you to GI. Will call you with CT results.   Constipation, Adult Constipation is when a person has fewer than three bowel movements in a week, has difficulty having a bowel movement, or has stools (feces) that are dry, hard, or larger than normal. Constipation may be caused by an underlying condition. It may become worse with age if a person takes certain medicines and does not take in enough fluids. Follow these instructions at home: Eating and drinking  Eat foods that have a lot of fiber, such as beans, whole grains, and fresh fruits and vegetables. Limit foods that are low in fiber and high in fat and processed sugars, such as fried or sweet foods. These include french fries, hamburgers, cookies, candies, and soda. Drink enough fluid to keep your urine pale yellow. General instructions Exercise regularly or as told by your health care provider. Try to do 150 minutes of moderate exercise each week. Use the bathroom when you have the urge to go. Do not hold it in. Take over-the-counter and prescription medicines only as told by your health care provider. This includes any fiber supplements. During bowel movements: Practice deep breathing while relaxing the lower abdomen. Practice pelvic floor relaxation. Watch your condition for any changes. Let your health care provider know about them. Keep all follow-up visits as told by your health care provider. This is important. Contact a health care provider if: You have pain that gets worse. You have a fever. You do not have a bowel movement after 4 days. You vomit. You are not hungry or you lose weight. You are bleeding from the opening between the buttocks (anus). You have thin, pencil-like stools. Get help right away if: You have a fever and your symptoms suddenly get worse. You leak stool or have blood in your stool. Your abdomen is  bloated. You have severe pain in your abdomen. You feel dizzy or you faint. Summary Constipation is when a person has fewer than three bowel movements in a week, has difficulty having a bowel movement, or has stools (feces) that are dry, hard, or larger than normal. Eat foods that have a lot of fiber, such as beans, whole grains, and fresh fruits and vegetables. Drink enough fluid to keep your urine pale yellow. Take over-the-counter and prescription medicines only as told by your health care provider. This includes any fiber supplements. This information is not intended to replace advice given to you by your health care provider. Make sure you discuss any questions you have with your health care provider.   Document Revised: 04/11/2019 Document Reviewed: 04/11/2019 Elsevier Patient Education  2022 [employer].

## 2021-05-20 ENCOUNTER — Telehealth: Payer: Self-pay | Admitting: *Deleted

## 2021-05-20 DIAGNOSIS — R6881 Early satiety: Secondary | ICD-10-CM

## 2021-05-20 DIAGNOSIS — R1084 Generalized abdominal pain: Secondary | ICD-10-CM

## 2021-05-20 DIAGNOSIS — K5900 Constipation, unspecified: Secondary | ICD-10-CM

## 2021-05-20 NOTE — Telephone Encounter (Signed)
Received verbal orders from MD for CT Abd/ Pelvis w/ contrast and referral to GI for constipation, generalized abd pain and early satiety.   Call placed to patient and patient made aware.   CT orders placed. Pre-certification is not required per [facility] website for CPT [phone]. Pre-certification is not required per Luella Cook with [facility] ([phone] telephone/ ID: [id number]) for CPT [phone]. Ref # [id number].   CT scheduled for 06/23/2021 @ 12 pm. Patient to be NPO 4 hours prior to imaging. Oral contrast to be picked up prior to day of imaging. Patient advised to drink contrast 2 hours and then 1 hour prior to imaging. Verbalized understanding. Letter sent with directions.   GI referral placed.

## 2021-05-21 ENCOUNTER — Encounter (INDEPENDENT_AMBULATORY_CARE_PROVIDER_SITE_OTHER): Payer: Self-pay | Admitting: *Deleted

## 2021-05-22 ENCOUNTER — Encounter: Payer: Self-pay | Admitting: General Surgery

## 2021-06-09 ENCOUNTER — Other Ambulatory Visit: Payer: Self-pay

## 2021-06-09 ENCOUNTER — Encounter (INDEPENDENT_AMBULATORY_CARE_PROVIDER_SITE_OTHER): Payer: Self-pay

## 2021-06-09 ENCOUNTER — Encounter (INDEPENDENT_AMBULATORY_CARE_PROVIDER_SITE_OTHER): Payer: Self-pay | Admitting: Gastroenterology

## 2021-06-09 ENCOUNTER — Ambulatory Visit (INDEPENDENT_AMBULATORY_CARE_PROVIDER_SITE_OTHER): Payer: 59 | Admitting: Gastroenterology

## 2021-06-09 ENCOUNTER — Other Ambulatory Visit (INDEPENDENT_AMBULATORY_CARE_PROVIDER_SITE_OTHER): Payer: Self-pay

## 2021-06-09 VITALS — BP 95/60 | HR 94 | Temp 98.2°F | Ht 65.0 in | Wt 145.0 lb

## 2021-06-09 DIAGNOSIS — I10 Essential (primary) hypertension: Secondary | ICD-10-CM

## 2021-06-09 DIAGNOSIS — R197 Diarrhea, unspecified: Secondary | ICD-10-CM

## 2021-06-09 DIAGNOSIS — R195 Other fecal abnormalities: Secondary | ICD-10-CM

## 2021-06-09 DIAGNOSIS — R194 Change in bowel habit: Secondary | ICD-10-CM

## 2021-06-09 NOTE — Patient Instructions (Signed)
We will get you scheduled for a colonoscopy to further evaluate your stool changes Please continue to eat meals as you can tolerate, you can add in boost/ensure protein shakes 2-3x/day as a snack to help maintain nutrition and avoid further weight loss

## 2021-06-09 NOTE — Progress Notes (Addendum)
Referring Provider: Lucretia RoersBridges, Lindsay C, MD Primary Care Physician:  Toma DeitersHasanaj, Xaje A, MD Primary GI Physician: newly established  Chief Complaint  Patient presents with   Constipation    Patient here today with complaints of constipation alternating with diarrhea. Denies dark or bloody stools, nausea,vomiting and abdominal pain.   HPI:   Mia Howe is a 81 y.o. female with past medical history of COPD, on 2L supplemental O2 chronically, CHF, HTN.  Patient presenting today as a new patient, referred by Dr. Henreitta LeberBridges for diarrhea/constipation.  Patient saw Dr. Henreitta LeberBridges 05/19/21 with c/o constipation and diarrhea with fullness/early satiety, decreased appetite and weight loss. She had CT ordered by Dr. Henreitta LeberBridges, however, this is not to be completed until 06/23/20.   Patient states that for the past month, she is having a mix of solid stool and diarrhea together each time she has a BM. She reports that she has 1 BM per day. She states that initially she was having some lower abdominal pain and if she would press on her abdomen, she would have to go to the restroom and move her bowels and pain would resolve, this occurred for only about 4 days total. She reports that she has had no further episodes of abdominal pain. She reports that solid stools are soft but skinny, like her finger. She denies any episodes of constipation. She denies rectal bleeding or black stools. She states that her appetite has been terrible as she just does not feel hungry, she was given some medication by her PCP to help increase her appetite. Notably, she has lost approx 17 pounds over the past few months. She denies any post prandial abdominal pain, nausea or vomiting. She denies any issues with dysphagia or acid reflux.  NSAID use:no NSAIDs Social hx: no etoh or tobacco Fam NG:EXBMWUhx:mother had colon cancer around age 82  Last Colonoscopy:around 2015, had a polyp removed Last Endoscopy:never  Past Medical History:   Diagnosis Date   CHF (congestive heart failure) (HCC)    COPD (chronic obstructive pulmonary disease) (HCC)    HTN (hypertension)     Past Surgical History:  Procedure Laterality Date   HERNIA REPAIR     HIP SURGERY     TOTAL ABDOMINAL HYSTERECTOMY      Current Outpatient Medications  Medication Sig Dispense Refill   acetaZOLAMIDE (DIAMOX) 250 MG tablet SMARTSIG:2 Tablet(s) By Mouth Every Other Day     albuterol (PROVENTIL) (2.5 MG/3ML) 0.083% nebulizer solution INHALE ONE vial via NEBULIZER EVERY 6 HOURS AS NEEDED     albuterol (VENTOLIN HFA) 108 (90 Base) MCG/ACT inhaler 2P q 4 hrs prn     alendronate (FOSAMAX) 70 MG tablet Take 70 mg by mouth once a week.     benazepril (LOTENSIN) 20 MG tablet Take 20 mg by mouth daily.     Calcium Carb-Cholecalciferol 600-10 MG-MCG TABS Take 1 tablet by mouth 3 (three) times daily.     escitalopram (LEXAPRO) 10 MG tablet Take 10 mg by mouth daily.     Fluticasone-Umeclidin-Vilant (TRELEGY ELLIPTA) 100-62.5-25 MCG/ACT AEPB TAKE 1 PUFF ONCE DAILY     furosemide (LASIX) 40 MG tablet Take 40 mg by mouth daily.     HYDROcodone-acetaminophen (NORCO) 7.5-325 MG tablet Take 1 tablet by mouth 2 (two) times daily.     ipratropium (ATROVENT) 0.02 % nebulizer solution INHALE CONTENTS OF 1 VIAL VIA NEBULIZER FOUR TIMES DAILY     latanoprost (XALATAN) 0.005 % ophthalmic solution ADMINISTER 1 DROP INTO BOTH EYES  AT BEDTIME     levocetirizine (XYZAL) 5 MG tablet Take 5 mg by mouth as needed.     megestrol (MEGACE) 20 MG tablet Take 20 mg by mouth daily.     Omega-3 1000 MG CAPS Take by mouth daily at 6 (six) AM.     OXYGEN Inhale into the lungs continuous.     tizanidine (ZANAFLEX) 2 MG capsule Take 2 mg by mouth as needed for muscle spasms.     No current facility-administered medications for this visit.    Allergies as of 06/09/2021 - Review Complete 06/09/2021  Allergen Reaction Noted   Levofloxacin  10/09/2008    Family History  Problem Relation  Age of Onset   Colon cancer Mother    Sarcoidosis Father     Social History   Socioeconomic History   Marital status: Married    Spouse name: Not on file   Number of children: Not on file   Years of education: Not on file   Highest education level: Not on file  Occupational History   Not on file  Tobacco Use   Smoking status: Former    Types: Cigarettes   Smokeless tobacco: Never  Vaping Use   Vaping Use: Never used  Substance and Sexual Activity   Alcohol use: Never   Drug use: Never   Sexual activity: Not on file  Other Topics Concern   Not on file  Social History Narrative   Not on file   Social Determinants of Health   Financial Resource Strain: Not on file  Food Insecurity: Not on file  Transportation Needs: Not on file  Physical Activity: Not on file  Stress: Not on file  Social Connections: Not on file   Review of systems General: negative for malaise, night sweats, fever, chills, +weight loss Neck: Negative for lumps, goiter, pain and significant neck swelling Resp: Negative for cough, wheezing, dyspnea at rest CV: Negative for chest pain, leg swelling, palpitations, orthopnea GI: denies melena, hematochezia, nausea, vomiting, constipation, dysphagia, odyonophagia, early satiety or unintentional weight loss. +diarrhea MSK: Negative for joint pain or swelling, back pain, and muscle pain. Derm: Negative for itching or rash Psych: Denies depression, anxiety, memory loss, confusion. No homicidal or suicidal ideation.  Heme: Negative for prolonged bleeding, bruising easily, and swollen nodes. Endocrine: Negative for cold or heat intolerance, polyuria, polydipsia and goiter. Neuro: negative for tremor, gait imbalance, syncope and seizures. The remainder of the review of systems is noncontributory.  Physical Exam: BP 95/60 (BP Location: Left Arm, Patient Position: Sitting, Cuff Size: Large)    Pulse 94    Temp 98.2 F (36.8 C) (Oral)    Ht 5\' 5"  (1.651 m)    Wt  145 lb (65.8 kg)    BMI 24.13 kg/m  General:   Alert and oriented. No distress noted. Pleasant and cooperative.  Head:  Normocephalic and atraumatic. Eyes:  Conjuctiva clear without scleral icterus. Mouth:  Oral mucosa pink and moist. Good dentition. No lesions. Heart: Normal rate and rhythm, s1 and s2 heart sounds present.  Lungs: Clear lung sounds in all lobes. Respirations equal and unlabored. Abdomen:  +BS, soft, non-tender and non-distended. No rebound or guarding. No HSM or masses noted. Derm: No palmar erythema or jaundice Msk:  Symmetrical without gross deformities. Normal posture. Extremities:  Without edema. Neurologic:  Alert and  oriented x4 Psych:  Alert and cooperative. Normal mood and affect.  Invalid input(s): 6 MONTHS   ASSESSMENT: Mia Howe is a 81  y.o. female presenting today for changes in stool caliber/diarrhea.  Patient has had some weight loss with decreased appetite and changes in stools over the past few months. She is having 1 BM per day with both solid and watery stools, and states solid stools have become more narrow. She is without rectal bleeding, abdominal pain, melena, nausea, vomiting, reflux symptoms or constipation that she is aware of. It is possible she is still experiencing some overflow diarrhea, however, we cannot rule out malignancy, especially given her weight loss and family history, we will proceed with colonoscopy for further evaluation.  Indications, risks and benefits of procedure discussed in detail with patient. Patient verbalized understanding and is in agreement to proceed with colonoscopy at this time.   PLAN:  Schedule colonoscopy 2. Protein shakes 2-3x/day 3. Patient to make me aware of new or worsening GI symptoms.   Follow Up: TBD after colonoscopy  Emmamarie Kluender L. Jeanmarie Hubert, MSN, APRN, AGNP-C Adult-Gerontology Nurse Practitioner [facility]

## 2021-06-09 NOTE — H&P (View-Only) (Signed)
° °Referring Provider: Bridges, Lindsay C, MD °Primary Care Physician:  Hasanaj, Xaje A, MD °Primary GI Physician: newly established ° °Chief Complaint  °Patient presents with  ° Constipation  °  Patient here today with complaints of constipation alternating with diarrhea. Denies dark or bloody stools, nausea,vomiting and abdominal pain.  ° °HPI:   °Mia Howe is a 80 y.o. female with past medical history of COPD, on 2L supplemental O2 chronically, CHF, HTN. ° °Patient presenting today as a new patient, referred by Dr. Bridges for diarrhea/constipation. ° °Patient saw Dr. Bridges 05/19/21 with c/o constipation and diarrhea with fullness/early satiety, decreased appetite and weight loss. She had CT ordered by Dr. Bridges, however, this is not to be completed until 06/23/20.  ° °Patient states that for the past month, she is having a mix of solid stool and diarrhea together each time she has a BM. She reports that she has 1 BM per day. She states that initially she was having some lower abdominal pain and if she would press on her abdomen, she would have to go to the restroom and move her bowels and pain would resolve, this occurred for only about 4 days total. She reports that she has had no further episodes of abdominal pain. She reports that solid stools are soft but skinny, like her finger. She denies any episodes of constipation. She denies rectal bleeding or black stools. She states that her appetite has been terrible as she just does not feel hungry, she was given some medication by her PCP to help increase her appetite. Notably, she has lost approx 17 pounds over the past few months. She denies any post prandial abdominal pain, nausea or vomiting. She denies any issues with dysphagia or acid reflux. ° °NSAID use:no NSAIDs °Social hx: no etoh or tobacco °Fam hx:mother had colon cancer around age 76 ° °Last Colonoscopy:around 2015, had a polyp removed °Last Endoscopy:never ° °Past Medical History:   °Diagnosis Date  ° CHF (congestive heart failure) (HCC)   ° COPD (chronic obstructive pulmonary disease) (HCC)   ° HTN (hypertension)   ° ° °Past Surgical History:  °Procedure Laterality Date  ° HERNIA REPAIR    ° HIP SURGERY    ° TOTAL ABDOMINAL HYSTERECTOMY    ° ° °Current Outpatient Medications  °Medication Sig Dispense Refill  ° acetaZOLAMIDE (DIAMOX) 250 MG tablet SMARTSIG:2 Tablet(s) By Mouth Every Other Day    ° albuterol (PROVENTIL) (2.5 MG/3ML) 0.083% nebulizer solution INHALE ONE vial via NEBULIZER EVERY 6 HOURS AS NEEDED    ° albuterol (VENTOLIN HFA) 108 (90 Base) MCG/ACT inhaler 2P q 4 hrs prn    ° alendronate (FOSAMAX) 70 MG tablet Take 70 mg by mouth once a week.    ° benazepril (LOTENSIN) 20 MG tablet Take 20 mg by mouth daily.    ° Calcium Carb-Cholecalciferol 600-10 MG-MCG TABS Take 1 tablet by mouth 3 (three) times daily.    ° escitalopram (LEXAPRO) 10 MG tablet Take 10 mg by mouth daily.    ° Fluticasone-Umeclidin-Vilant (TRELEGY ELLIPTA) 100-62.5-25 MCG/ACT AEPB TAKE 1 PUFF ONCE DAILY    ° furosemide (LASIX) 40 MG tablet Take 40 mg by mouth daily.    ° HYDROcodone-acetaminophen (NORCO) 7.5-325 MG tablet Take 1 tablet by mouth 2 (two) times daily.    ° ipratropium (ATROVENT) 0.02 % nebulizer solution INHALE CONTENTS OF 1 VIAL VIA NEBULIZER FOUR TIMES DAILY    ° latanoprost (XALATAN) 0.005 % ophthalmic solution ADMINISTER 1 DROP INTO BOTH EYES   AT BEDTIME     levocetirizine (XYZAL) 5 MG tablet Take 5 mg by mouth as needed.     megestrol (MEGACE) 20 MG tablet Take 20 mg by mouth daily.     Omega-3 1000 MG CAPS Take by mouth daily at 6 (six) AM.     OXYGEN Inhale into the lungs continuous.     tizanidine (ZANAFLEX) 2 MG capsule Take 2 mg by mouth as needed for muscle spasms.     No current facility-administered medications for this visit.    Allergies as of 06/09/2021 - Review Complete 06/09/2021  Allergen Reaction Noted   Levofloxacin  10/09/2008    Family History  Problem Relation  Age of Onset   Colon cancer Mother    Sarcoidosis Father     Social History   Socioeconomic History   Marital status: Married    Spouse name: Not on file   Number of children: Not on file   Years of education: Not on file   Highest education level: Not on file  Occupational History   Not on file  Tobacco Use   Smoking status: Former    Types: Cigarettes   Smokeless tobacco: Never  Vaping Use   Vaping Use: Never used  Substance and Sexual Activity   Alcohol use: Never   Drug use: Never   Sexual activity: Not on file  Other Topics Concern   Not on file  Social History Narrative   Not on file   Social Determinants of Health   Financial Resource Strain: Not on file  Food Insecurity: Not on file  Transportation Needs: Not on file  Physical Activity: Not on file  Stress: Not on file  Social Connections: Not on file   Review of systems General: negative for malaise, night sweats, fever, chills, +weight loss Neck: Negative for lumps, goiter, pain and significant neck swelling Resp: Negative for cough, wheezing, dyspnea at rest CV: Negative for chest pain, leg swelling, palpitations, orthopnea GI: denies melena, hematochezia, nausea, vomiting, constipation, dysphagia, odyonophagia, early satiety or unintentional weight loss. +diarrhea MSK: Negative for joint pain or swelling, back pain, and muscle pain. Derm: Negative for itching or rash Psych: Denies depression, anxiety, memory loss, confusion. No homicidal or suicidal ideation.  Heme: Negative for prolonged bleeding, bruising easily, and swollen nodes. Endocrine: Negative for cold or heat intolerance, polyuria, polydipsia and goiter. Neuro: negative for tremor, gait imbalance, syncope and seizures. The remainder of the review of systems is noncontributory.  Physical Exam: BP 95/60 (BP Location: Left Arm, Patient Position: Sitting, Cuff Size: Large)    Pulse 94    Temp 98.2 F (36.8 C) (Oral)    Ht 5\' 5"  (1.651 m)    Wt  145 lb (65.8 kg)    BMI 24.13 kg/m  General:   Alert and oriented. No distress noted. Pleasant and cooperative.  Head:  Normocephalic and atraumatic. Eyes:  Conjuctiva clear without scleral icterus. Mouth:  Oral mucosa pink and moist. Good dentition. No lesions. Heart: Normal rate and rhythm, s1 and s2 heart sounds present.  Lungs: Clear lung sounds in all lobes. Respirations equal and unlabored. Abdomen:  +BS, soft, non-tender and non-distended. No rebound or guarding. No HSM or masses noted. Derm: No palmar erythema or jaundice Msk:  Symmetrical without gross deformities. Normal posture. Extremities:  Without edema. Neurologic:  Alert and  oriented x4 Psych:  Alert and cooperative. Normal mood and affect.  Invalid input(s): 6 MONTHS   ASSESSMENT: Mia Howe is a 81  y.o. female presenting today for changes in stool caliber/diarrhea.  Patient has had some weight loss with decreased appetite and changes in stools over the past few months. She is having 1 BM per day with both solid and watery stools, and states solid stools have become more narrow. She is without rectal bleeding, abdominal pain, melena, nausea, vomiting, reflux symptoms or constipation that she is aware of. It is possible she is still experiencing some overflow diarrhea, however, we cannot rule out malignancy, especially given her weight loss and family history, we will proceed with colonoscopy for further evaluation.  Indications, risks and benefits of procedure discussed in detail with patient. Patient verbalized understanding and is in agreement to proceed with colonoscopy at this time.   PLAN:  Schedule colonoscopy 2. Protein shakes 2-3x/day 3. Patient to make me aware of new or worsening GI symptoms.   Follow Up: TBD after colonoscopy  Leondre Taul L. Jeanmarie Hubert, MSN, APRN, AGNP-C Adult-Gerontology Nurse Practitioner [facility]

## 2021-06-10 ENCOUNTER — Other Ambulatory Visit (HOSPITAL_COMMUNITY)
Admission: RE | Admit: 2021-06-10 | Discharge: 2021-06-10 | Disposition: A | Discharge status: Home / Self Care | Payer: 59 | Source: Ambulatory Visit | Attending: Gastroenterology | Admitting: Gastroenterology

## 2021-06-10 ENCOUNTER — Other Ambulatory Visit: Payer: Self-pay

## 2021-06-10 ENCOUNTER — Encounter (HOSPITAL_COMMUNITY): Payer: Self-pay

## 2021-06-10 ENCOUNTER — Encounter (HOSPITAL_COMMUNITY)
Admission: RE | Admit: 2021-06-10 | Discharge: 2021-06-10 | Disposition: A | Discharge status: Home / Self Care | Payer: 59 | Source: Ambulatory Visit | Attending: Gastroenterology | Admitting: Gastroenterology

## 2021-06-10 DIAGNOSIS — R194 Change in bowel habit: Secondary | ICD-10-CM | POA: Insufficient documentation

## 2021-06-10 DIAGNOSIS — I10 Essential (primary) hypertension: Secondary | ICD-10-CM | POA: Diagnosis not present

## 2021-06-10 DIAGNOSIS — Z01812 Encounter for preprocedural laboratory examination: Secondary | ICD-10-CM | POA: Diagnosis not present

## 2021-06-10 LAB — BASIC METABOLIC PANEL
Anion gap: 6 (ref 5–15)
BUN: 26 mg/dL — ABNORMAL HIGH (ref 8–23)
CO2: 27 mmol/L (ref 22–32)
Calcium: 8.8 mg/dL — ABNORMAL LOW (ref 8.9–10.3)
Chloride: 101 mmol/L (ref 98–111)
Creatinine, Ser: 1.26 mg/dL — ABNORMAL HIGH (ref 0.44–1.00)
GFR, Estimated: 43 mL/min — ABNORMAL LOW (ref >60)
Glucose, Bld: 107 mg/dL — ABNORMAL HIGH (ref 70–99)
Potassium: 3.7 mmol/L (ref 3.5–5.1)
Sodium: 134 mmol/L — ABNORMAL LOW (ref 135–145)

## 2021-06-12 ENCOUNTER — Ambulatory Visit (HOSPITAL_COMMUNITY): Payer: 59 | Admitting: Anesthesiology

## 2021-06-12 ENCOUNTER — Other Ambulatory Visit (INDEPENDENT_AMBULATORY_CARE_PROVIDER_SITE_OTHER): Payer: Self-pay

## 2021-06-12 ENCOUNTER — Ambulatory Visit (HOSPITAL_COMMUNITY)
Admission: RE | Admit: 2021-06-12 | Discharge: 2021-06-12 | Disposition: A | Discharge status: Home / Self Care | Payer: 59 | Attending: Gastroenterology | Admitting: Gastroenterology

## 2021-06-12 ENCOUNTER — Encounter (HOSPITAL_COMMUNITY): Payer: Self-pay | Admitting: Gastroenterology

## 2021-06-12 ENCOUNTER — Encounter (HOSPITAL_COMMUNITY)
Admission: RE | Disposition: A | Discharge status: Home / Self Care | Payer: Self-pay | Source: Home / Self Care | Attending: Gastroenterology

## 2021-06-12 DIAGNOSIS — I110 Hypertensive heart disease with heart failure: Secondary | ICD-10-CM | POA: Diagnosis not present

## 2021-06-12 DIAGNOSIS — R634 Abnormal weight loss: Secondary | ICD-10-CM | POA: Insufficient documentation

## 2021-06-12 DIAGNOSIS — Z79899 Other long term (current) drug therapy: Secondary | ICD-10-CM | POA: Diagnosis not present

## 2021-06-12 DIAGNOSIS — R194 Change in bowel habit: Secondary | ICD-10-CM | POA: Diagnosis not present

## 2021-06-12 DIAGNOSIS — Z800 Family history of malignant neoplasm of digestive organs: Secondary | ICD-10-CM | POA: Insufficient documentation

## 2021-06-12 DIAGNOSIS — I509 Heart failure, unspecified: Secondary | ICD-10-CM | POA: Diagnosis not present

## 2021-06-12 DIAGNOSIS — R627 Adult failure to thrive: Secondary | ICD-10-CM | POA: Insufficient documentation

## 2021-06-12 DIAGNOSIS — K3189 Other diseases of stomach and duodenum: Secondary | ICD-10-CM

## 2021-06-12 DIAGNOSIS — Z7951 Long term (current) use of inhaled steroids: Secondary | ICD-10-CM | POA: Insufficient documentation

## 2021-06-12 DIAGNOSIS — K648 Other hemorrhoids: Secondary | ICD-10-CM | POA: Diagnosis not present

## 2021-06-12 DIAGNOSIS — I10 Essential (primary) hypertension: Secondary | ICD-10-CM

## 2021-06-12 DIAGNOSIS — K2950 Unspecified chronic gastritis without bleeding: Secondary | ICD-10-CM | POA: Insufficient documentation

## 2021-06-12 DIAGNOSIS — J449 Chronic obstructive pulmonary disease, unspecified: Secondary | ICD-10-CM | POA: Diagnosis not present

## 2021-06-12 DIAGNOSIS — Z87891 Personal history of nicotine dependence: Secondary | ICD-10-CM | POA: Insufficient documentation

## 2021-06-12 DIAGNOSIS — Z980 Intestinal bypass and anastomosis status: Secondary | ICD-10-CM | POA: Insufficient documentation

## 2021-06-12 DIAGNOSIS — K317 Polyp of stomach and duodenum: Secondary | ICD-10-CM | POA: Diagnosis not present

## 2021-06-12 HISTORY — PX: COLONOSCOPY WITH PROPOFOL: SHX5780

## 2021-06-12 HISTORY — PX: BIOPSY: SHX5522

## 2021-06-12 HISTORY — PX: POLYPECTOMY: SHX5525

## 2021-06-12 HISTORY — PX: ESOPHAGOGASTRODUODENOSCOPY (EGD) WITH PROPOFOL: SHX5813

## 2021-06-12 LAB — HM COLONOSCOPY

## 2021-06-12 SURGERY — COLONOSCOPY WITH PROPOFOL
Anesthesia: General

## 2021-06-12 MED ORDER — EPHEDRINE 5 MG/ML INJ
INTRAVENOUS | Status: AC
  Filled 2021-06-12: qty 5

## 2021-06-12 MED ORDER — PROPOFOL 10 MG/ML IV BOLUS
INTRAVENOUS | Status: DC | PRN
Start: 2021-06-12 — End: 2021-06-12
  Administered 2021-06-12: 20 mg via INTRAVENOUS

## 2021-06-12 MED ORDER — PHENYLEPHRINE HCL (PRESSORS) 10 MG/ML IV SOLN
INTRAVENOUS | Status: AC
  Filled 2021-06-12: qty 1

## 2021-06-12 MED ORDER — PROPOFOL 500 MG/50ML IV EMUL
INTRAVENOUS | Status: DC | PRN
  Administered 2021-06-12: 150 ug/kg/min via INTRAVENOUS

## 2021-06-12 MED ORDER — LIDOCAINE HCL (PF) 2 % IJ SOLN
INTRAMUSCULAR | Status: AC
  Filled 2021-06-12: qty 5

## 2021-06-12 MED ORDER — LACTATED RINGERS IV SOLN: INTRAVENOUS | Status: DC | PRN

## 2021-06-12 MED ORDER — PHENYLEPHRINE HCL (PRESSORS) 10 MG/ML IV SOLN
INTRAVENOUS | Status: DC | PRN
  Administered 2021-06-12 (×5): 200 ug via INTRAVENOUS

## 2021-06-12 MED ORDER — PHENYLEPHRINE 40 MCG/ML (10ML) SYRINGE FOR IV PUSH (FOR BLOOD PRESSURE SUPPORT)
PREFILLED_SYRINGE | INTRAVENOUS | Status: AC
  Filled 2021-06-12: qty 30

## 2021-06-12 MED ORDER — SODIUM CHLORIDE 0.9 % IV SOLN: INTRAVENOUS | Status: DC

## 2021-06-12 MED ORDER — EPHEDRINE SULFATE 50 MG/ML IJ SOLN
INTRAMUSCULAR | Status: DC | PRN
  Administered 2021-06-12: 10 mg via INTRAVENOUS

## 2021-06-12 NOTE — Discharge Instructions (Signed)
You are being discharged to home.  Resume your previous diet.  We are waiting for your pathology results.  Your physician has indicated that a repeat colonoscopy is not recommended due to your current age (66 years or older) for screening purposes.  

## 2021-06-12 NOTE — Transfer of Care (Signed)
Immediate Anesthesia Transfer of Care Note  Patient: Mia Howe  Procedure(s) Performed: COLONOSCOPY WITH PROPOFOL ESOPHAGOGASTRODUODENOSCOPY (EGD) WITH PROPOFOL BIOPSY POLYPECTOMY  Patient Location: PACU  Anesthesia Type:General  Level of Consciousness: awake, alert  and patient cooperative  Airway & Oxygen Therapy: Patient Spontanous Breathing and Patient connected to nasal cannula oxygen  Post-op Assessment: Report given to RN, Post -op Vital signs reviewed and stable and Patient moving all extremities X 4  Post vital signs: Reviewed and stable  Last Vitals:  Vitals Value Taken Time  BP    Temp    Pulse    Resp    SpO2      Last Pain:  Vitals:   06/12/21 0910  TempSrc:   PainSc: 0-No pain         Complications: No notable events documented.

## 2021-06-12 NOTE — Interval H&P Note (Signed)
History and Physical Interval Note:  06/12/2021 7:40 AM  Mia Howe  has presented today for surgery, with the diagnosis of changes in stool an weight loss.  The various methods of treatment have been discussed with the patient and family. After consideration of risks, benefits and other options for treatment, the patient has consented to  Procedure(s) with comments: COLONOSCOPY WITH PROPOFOL (N/A) - 730 RM 1 / no pac req as a surgical intervention.  The patient's history has been reviewed, patient examined, no change in status, stable for surgery.  I have reviewed the patient's chart and labs.  Questions were answered to the patient's satisfaction.     Katrinka Blazing Mayorga

## 2021-06-12 NOTE — Anesthesia Preprocedure Evaluation (Signed)
Anesthesia Evaluation  Patient identified by MRN, date of birth, ID band Patient awake    Reviewed: Allergy & Precautions, H&P , NPO status , Patient's Chart, lab work & pertinent test results, reviewed documented beta blocker date and time   Airway Mallampati: II  TM Distance: >3 FB Neck ROM: full    Dental no notable dental hx.    Pulmonary COPD, former smoker,    Pulmonary exam normal breath sounds clear to auscultation       Cardiovascular Exercise Tolerance: Good hypertension, negative cardio ROS   Rhythm:regular Rate:Normal     Neuro/Psych negative neurological ROS  negative psych ROS   GI/Hepatic negative GI ROS, Neg liver ROS,   Endo/Other  negative endocrine ROS  Renal/GU negative Renal ROS  negative genitourinary   Musculoskeletal   Abdominal   Peds  Hematology negative hematology ROS (+)   Anesthesia Other Findings   Reproductive/Obstetrics negative OB ROS                             Anesthesia Physical Anesthesia Plan  ASA: 3  Anesthesia Plan: General   Post-op Pain Management:    Induction:   PONV Risk Score and Plan: Propofol infusion  Airway Management Planned:   Additional Equipment:   Intra-op Plan:   Post-operative Plan:   Informed Consent: I have reviewed the patients History and Physical, chart, labs and discussed the procedure including the risks, benefits and alternatives for the proposed anesthesia with the patient or authorized representative who has indicated his/her understanding and acceptance.     Dental Advisory Given  Plan Discussed with: CRNA  Anesthesia Plan Comments:         Anesthesia Quick Evaluation

## 2021-06-12 NOTE — Op Note (Signed)
[facility] Patient Name: Mia MillmanGeneva Howe Procedure Date: 06/12/2021 8:55 AM MRN: [id number] Date of Birth: 11/15/1940 Attending MD: Katrinka Blazinganiel Castaneda ,  CSN: [id number] Age: 81 Admit Type: Outpatient Procedure:                Upper GI endoscopy Indications:              Failure to thrive, Weight loss Providers:                Katrinka Blazinganiel Castaneda, Enzo MontgomeryEmilee Gantt, RN, Tammy Vaught,                            RN, Edythe ClarityKelly Cox, Technician Referring MD:              Medicines:                Monitored Anesthesia Care Complications:            No immediate complications. Estimated Blood Loss:     Estimated blood loss: none. Procedure:                Pre-Anesthesia Assessment:                           - Prior to the procedure, a History and Physical                            was performed, and patient medications, allergies                            and sensitivities were reviewed. The patient's                            tolerance of previous anesthesia was reviewed.                           - The risks and benefits of the procedure and the                            sedation options and risks were discussed with the                            patient. All questions were answered and informed                            consent was obtained.                           - ASA Grade Assessment: II - A patient with mild                            systemic disease.                           After obtaining informed consent, the endoscope was                            passed under direct vision. Throughout  the                            procedure, the patient's blood pressure, pulse, and                            oxygen saturations were monitored continuously. The                            [id number]-[id number] ([id number]) scope was introduced through the                            mouth, and advanced to the second part of duodenum.                            The upper GI endoscopy was accomplished without                             difficulty. The patient tolerated the procedure                            well. Scope In: 9:14:47 AM Scope Out: 9:27:00 AM Total Procedure Duration: 0 hours 12 minutes 13 seconds  Findings:      The examined esophagus was normal.      Two 2 to 3 mm sessile polyps with no bleeding were found in the gastric       fundus. Biopsies were taken with a cold forceps for histology.      Two localized small erosions with no stigmata of recent bleeding were       found in the gastric antrum. Biopsies were taken with a cold forceps for       Helicobacter pylori testing.      The examined duodenum was normal. Impression:               - Normal esophagus.                           - Two gastric polyps. Biopsied.                           - Erosive gastropathy with no stigmata of recent                            bleeding. Biopsied.                           - Normal examined duodenum. Moderate Sedation:      Per Anesthesia Care Recommendation:           - Discharge patient to home (ambulatory).                           - Resume previous diet.                           - Await pathology results. Procedure Code(s):        --- Professional ---  92010, Esophagogastroduodenoscopy, flexible,                            transoral; with biopsy, single or multiple Diagnosis Code(s):        --- Professional ---                           K31.7, Polyp of stomach and duodenum                           K31.89, Other diseases of stomach and duodenum                           R62.7, Adult failure to thrive                           R63.4, Abnormal weight loss CPT copyright 2019 American Medical Association. All rights reserved. The codes documented in this report are preliminary and upon coder review may  be revised to meet current compliance requirements. Katrinka Blazing, MD Katrinka Blazing,  06/12/2021 9:57:32 AM This report has been signed  electronically. Number of Addenda: 0

## 2021-06-12 NOTE — Anesthesia Postprocedure Evaluation (Signed)
Anesthesia Post Note  Patient: Mia Howe  Procedure(s) Performed: COLONOSCOPY WITH PROPOFOL ESOPHAGOGASTRODUODENOSCOPY (EGD) WITH PROPOFOL BIOPSY POLYPECTOMY  Patient location during evaluation: Phase II Anesthesia Type: General Level of consciousness: awake Pain management: pain level controlled Vital Signs Assessment: post-procedure vital signs reviewed and stable Respiratory status: spontaneous breathing and respiratory function stable Cardiovascular status: blood pressure returned to baseline and stable Postop Assessment: no headache and no apparent nausea or vomiting Anesthetic complications: no Comments: Late entry   No notable events documented.   Last Vitals:  Vitals:   06/12/21 1000 06/12/21 1016  BP: (!) 104/36 123/62  Pulse: 86   Resp: 16   Temp:    SpO2: 100%     Last Pain:  Vitals:   06/12/21 0910  TempSrc:   PainSc: 0-No pain                 Windell Norfolk

## 2021-06-12 NOTE — Op Note (Signed)
[facility] Patient Name: Mia Howe Procedure Date: 06/12/2021 7:14 AM MRN: [id number] Date of Birth: Sep 29, 1940 Attending MD: Katrinka Blazing ,  CSN: [id number] Age: 81 Admit Type: Outpatient Procedure:                Colonoscopy Indications:              Change in bowel habits Providers:                Katrinka Blazing, Edrick Kins, RN, Enzo Montgomery,                            RN, Edythe Clarity, Technician Referring MD:              Medicines:                Monitored Anesthesia Care Complications:            No immediate complications. Estimated Blood Loss:     Estimated blood loss: none. Procedure:                Pre-Anesthesia Assessment:                           - Prior to the procedure, a History and Physical                            was performed, and patient medications, allergies                            and sensitivities were reviewed. The patient's                            tolerance of previous anesthesia was reviewed.                           - The risks and benefits of the procedure and the                            sedation options and risks were discussed with the                            patient. All questions were answered and informed                            consent was obtained.                           - ASA Grade Assessment: II - A patient with mild                            systemic disease.                           After obtaining informed consent, the colonoscope                            was passed under direct vision. Throughout the  procedure, the patient's blood pressure, pulse, and                            oxygen saturations were monitored continuously. The                            [id number]-[id number] scope was introduced through                            the anus and advanced to the the ileocolonic                            anastomosis. The colonoscopy was performed without                             difficulty. The patient tolerated the procedure                            well. The quality of the bowel preparation was good. Scope In: 9:31:34 AM Scope Out: 9:54:56 AM Scope Withdrawal Time: 0 hours 17 minutes 48 seconds  Total Procedure Duration: 0 hours 23 minutes 22 seconds  Findings:      The perianal and digital rectal examinations were normal.      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 This was patent and was characterized by healthy       appearing mucosa. The anastomosis was traversed.      The colon (entire examined portion) appeared normal. Biopsies for       histology were taken with a cold forceps from the right colon and left       colon for evaluation of microscopic colitis.      Non-bleeding internal hemorrhoids were found during retroflexion. The       hemorrhoids were small. Impression:               - Patent end-to-side ileo-colonic anastomosis,                            characterized by healthy appearing mucosa.                           - The entire examined colon is normal. Biopsied.                           - Non-bleeding internal hemorrhoids. Moderate Sedation:      Per Anesthesia Care Recommendation:           - Discharge patient to home (ambulatory).                           - Resume previous diet.                           - Await pathology results.                           - Repeat colonoscopy is not recommended due  to                            current age (57 years or older) for screening                            purposes. Procedure Code(s):        --- Professional ---                           [phone], Colonoscopy, flexible; with biopsy, single                            or multiple Diagnosis Code(s):        --- Professional ---                           Z98.0, Intestinal bypass and anastomosis status                           K64.8, Other hemorrhoids                           R19.4,  Change in bowel habit CPT copyright 2019 American Medical Association. All rights reserved. The codes documented in this report are preliminary and upon coder review may  be revised to meet current compliance requirements. Mia Peppers, MD Mia Howe,  06/12/2021 10:02:39 AM This report has been signed electronically. Number of Addenda: 0

## 2021-06-15 ENCOUNTER — Telehealth (INDEPENDENT_AMBULATORY_CARE_PROVIDER_SITE_OTHER): Payer: Self-pay

## 2021-06-15 NOTE — Telephone Encounter (Signed)
I spoke to patient over the EGD and colonoscopy findings, pathology report for the gastric and colonic biopsies are still pending, we will call her with the results.

## 2021-06-15 NOTE — Telephone Encounter (Signed)
Patient family called today for the results of EGD and TCS from 06/12/2021. Please advise.

## 2021-06-16 LAB — SURGICAL PATHOLOGY

## 2021-06-17 ENCOUNTER — Encounter (HOSPITAL_COMMUNITY): Payer: Self-pay | Admitting: Gastroenterology

## 2021-06-17 ENCOUNTER — Encounter (INDEPENDENT_AMBULATORY_CARE_PROVIDER_SITE_OTHER): Payer: Self-pay | Admitting: *Deleted

## 2021-06-23 ENCOUNTER — Ambulatory Visit (HOSPITAL_COMMUNITY)
Admission: RE | Admit: 2021-06-23 | Discharge: 2021-06-23 | Disposition: A | Discharge status: Home / Self Care | Payer: 59 | Source: Ambulatory Visit | Attending: General Surgery | Admitting: General Surgery

## 2021-06-23 ENCOUNTER — Other Ambulatory Visit: Payer: Self-pay

## 2021-06-23 DIAGNOSIS — K5900 Constipation, unspecified: Secondary | ICD-10-CM | POA: Diagnosis present

## 2021-06-23 DIAGNOSIS — R1084 Generalized abdominal pain: Secondary | ICD-10-CM

## 2021-06-23 MED ORDER — IOHEXOL 300 MG/ML  SOLN
75.0000 mL | Freq: Once | INTRAMUSCULAR | Status: AC | PRN
  Administered 2021-06-23: 75 mL via INTRAVENOUS

## 2021-06-25 NOTE — Progress Notes (Signed)
Please call and let patient know I have reviewed CT. No acute findings. She had her EGD and Colonoscopy and those were reassuring. When I spoke to her in December, she was not having gallbladder type symptoms, but if she starts having pain with eating or issues with eating greasy meals we can see her back to readdress. I hope she is doing well.

## 2022-04-04 ENCOUNTER — Emergency Department (HOSPITAL_COMMUNITY): Payer: 59

## 2022-04-04 ENCOUNTER — Emergency Department (HOSPITAL_COMMUNITY)
Admission: EM | Admit: 2022-04-04 | Discharge: 2022-04-04 | Disposition: A | Discharge status: Home / Self Care | Payer: 59 | Attending: Emergency Medicine | Admitting: Emergency Medicine

## 2022-04-04 ENCOUNTER — Encounter (HOSPITAL_COMMUNITY): Payer: Self-pay | Admitting: *Deleted

## 2022-04-04 ENCOUNTER — Other Ambulatory Visit: Payer: Self-pay

## 2022-04-04 DIAGNOSIS — J449 Chronic obstructive pulmonary disease, unspecified: Secondary | ICD-10-CM | POA: Diagnosis not present

## 2022-04-04 DIAGNOSIS — I509 Heart failure, unspecified: Secondary | ICD-10-CM | POA: Insufficient documentation

## 2022-04-04 DIAGNOSIS — R059 Cough, unspecified: Secondary | ICD-10-CM | POA: Insufficient documentation

## 2022-04-04 DIAGNOSIS — R0602 Shortness of breath: Secondary | ICD-10-CM

## 2022-04-04 DIAGNOSIS — Z20822 Contact with and (suspected) exposure to covid-19: Secondary | ICD-10-CM | POA: Diagnosis not present

## 2022-04-04 DIAGNOSIS — I110 Hypertensive heart disease with heart failure: Secondary | ICD-10-CM | POA: Diagnosis not present

## 2022-04-04 HISTORY — DX: Pneumonia, unspecified organism: J18.9

## 2022-04-04 LAB — BASIC METABOLIC PANEL
Anion gap: 8 (ref 5–15)
BUN: 17 mg/dL (ref 8–23)
CO2: 24 mmol/L (ref 22–32)
Calcium: 8.6 mg/dL — ABNORMAL LOW (ref 8.9–10.3)
Chloride: 107 mmol/L (ref 98–111)
Creatinine, Ser: 0.98 mg/dL (ref 0.44–1.00)
GFR, Estimated: 58 mL/min — ABNORMAL LOW (ref >60)
Glucose, Bld: 119 mg/dL — ABNORMAL HIGH (ref 70–99)
Potassium: 3.3 mmol/L — ABNORMAL LOW (ref 3.5–5.1)
Sodium: 139 mmol/L (ref 135–145)

## 2022-04-04 LAB — TROPONIN I (HIGH SENSITIVITY)
Troponin I (High Sensitivity): 10 ng/L (ref <18)
Troponin I (High Sensitivity): 11 ng/L (ref <18)

## 2022-04-04 LAB — CBC
HCT: 32.5 % — ABNORMAL LOW (ref 36.0–46.0)
Hemoglobin: 10.2 g/dL — ABNORMAL LOW (ref 12.0–15.0)
MCH: 31.7 pg (ref 26.0–34.0)
MCHC: 31.4 g/dL (ref 30.0–36.0)
MCV: 100.9 fL — ABNORMAL HIGH (ref 80.0–100.0)
Platelets: 179 10*3/uL (ref 150–400)
RBC: 3.22 MIL/uL — ABNORMAL LOW (ref 3.87–5.11)
RDW: 12.5 % (ref 11.5–15.5)
WBC: 7.4 10*3/uL (ref 4.0–10.5)
nRBC: 0 % (ref 0.0–0.2)

## 2022-04-04 LAB — RESP PANEL BY RT-PCR (FLU A&B, COVID) ARPGX2
Influenza A by PCR: NEGATIVE
Influenza B by PCR: NEGATIVE
SARS Coronavirus 2 by RT PCR: NEGATIVE

## 2022-04-04 MED ORDER — SODIUM CHLORIDE 0.9 % IV SOLN: 2.0000 g | Freq: Once | INTRAVENOUS | Status: DC

## 2022-04-04 MED ORDER — CEFTRIAXONE SODIUM 1 G IJ SOLR
1.0000 g | Freq: Once | INTRAMUSCULAR | Status: AC
  Administered 2022-04-04: 1 g via INTRAMUSCULAR
  Filled 2022-04-04: qty 10

## 2022-04-04 MED ORDER — LIDOCAINE HCL (PF) 1 % IJ SOLN
INTRAMUSCULAR | Status: AC
  Filled 2022-04-04: qty 5

## 2022-04-04 MED ORDER — DOXYCYCLINE HYCLATE 100 MG PO TABS
100.0000 mg | ORAL_TABLET | Freq: Once | ORAL | Status: AC
  Administered 2022-04-04: 100 mg via ORAL
  Filled 2022-04-04: qty 1

## 2022-04-04 MED ORDER — DOXYCYCLINE HYCLATE 100 MG PO CAPS: 100.0000 mg | ORAL_CAPSULE | Freq: Two times a day (BID) | ORAL | 0 refills | Status: AC

## 2022-04-04 NOTE — ED Provider Notes (Signed)
[facility] EMERGENCY DEPARTMENT Provider Note   CSN: [id number] Arrival date & time: 04/04/22  1423     History  Chief Complaint  Patient presents with   Shortness of Breath   HPI Mia Howe is a 81 y.o. female with COPD, CHF and hypertension presenting for shortness of breath.  This of breath started this morning after she awoke from sleep.  It was worse with exertion.  States that she is normally on 2 L nasal cannula because of her COPD.  Also endorses a cough that is been going on for week.  Cough is productive with white and yellowish sputum. States that she was recently admitted for pneumonia and COVID.  Family members were concerned that she is developing a new pneumonia given her symptoms.  Denies fever and chest pain.  Patient states that at this time she is not short of breath and that she overall feels well.  Also denies lower extremity edema.   Shortness of Breath      Home Medications Prior to Admission medications   Medication Sig Start Date End Date Taking? Authorizing Provider  doxycycline (VIBRAMYCIN) 100 MG capsule Take 1 capsule (100 mg total) by mouth 2 (two) times daily for 7 days. 04/04/22 04/11/22 Yes Gareth Eagle, PA-C  acetaZOLAMIDE (DIAMOX) 250 MG tablet Take 250 mg by mouth every other day. 03/09/21   [provider]  albuterol (PROVENTIL) (2.5 MG/3ML) 0.083% nebulizer solution Take 2.5 mg by nebulization every 4 (four) hours as needed for wheezing or shortness of breath. 11/07/20   [provider]  albuterol (VENTOLIN HFA) 108 (90 Base) MCG/ACT inhaler 2 puffs every 4 (four) hours as needed for wheezing or shortness of breath. 10/29/98   [provider]  alendronate (FOSAMAX) 70 MG tablet Take 70 mg by mouth once a week. 06/11/19   [provider]  azelastine (ASTELIN) 0.1 % nasal spray Place 1 spray into both nostrils 2 (two) times daily. Use in each nostril as directed    [provider]  benazepril  (LOTENSIN) 40 MG tablet Take 40 mg by mouth daily. 06/12/19   [provider]  BIOTIN PO Take 1 tablet by mouth daily.    [provider]  Calcium-Magnesium-Vitamin D (CALCIUM MAGNESIUM PO) Take 1 tablet by mouth 2 (two) times daily.    [provider]  escitalopram (LEXAPRO) 10 MG tablet Take 10 mg by mouth daily. 12/24/20   [provider]  Fluticasone-Umeclidin-Vilant (TRELEGY ELLIPTA) 100-62.5-25 MCG/ACT AEPB Take 1 puff by mouth daily. 06/12/19   [provider]  furosemide (LASIX) 40 MG tablet Take 40 mg by mouth daily. 09/11/13   [provider]  HYDROcodone-acetaminophen (NORCO) 7.5-325 MG tablet Take 1 tablet by mouth 2 (two) times daily as needed for moderate pain or severe pain. 01/28/21   [provider]  latanoprost (XALATAN) 0.005 % ophthalmic solution Place 1 drop into both eyes at bedtime. 10/03/13   [provider]  levocetirizine (XYZAL) 5 MG tablet Take 5 mg by mouth daily. 10/28/20   [provider]  megestrol (MEGACE) 20 MG tablet Take 20 mg by mouth daily.    [provider]  Omega-3 1000 MG CAPS Take 1,000 mg by mouth daily at 6 (six) AM. XL    [provider]  OXYGEN Inhale 2-3 L into the lungs continuous.    [provider]  tizanidine (ZANAFLEX) 2 MG capsule Take 2 mg by mouth daily as needed for muscle spasms.  [provider]      Allergies    Levofloxacin    Review of Systems   Review of Systems  Respiratory:  Positive for shortness of breath.     Physical Exam Updated Vital Signs BP 125/61 (BP Location: Right Arm)   Pulse 88   Temp 98.3 F (36.8 C) (Oral)   Resp 16   Ht 5\' 4"  (1.626 m)   Wt 65.3 kg   SpO2 97%   BMI 24.72 kg/m  Physical Exam Vitals and nursing note reviewed.  HENT:     Head: Normocephalic and atraumatic.     Mouth/Throat:     Mouth: Mucous membranes are moist.  Eyes:     General:        Right eye: No discharge.         Left eye: No discharge.     Conjunctiva/sclera: Conjunctivae normal.  Cardiovascular:     Rate and Rhythm: Normal rate and regular rhythm.     Pulses: Normal pulses.     Heart sounds: Normal heart sounds.  Pulmonary:     Effort: Pulmonary effort is normal.     Breath sounds: Normal breath sounds and air entry. No decreased breath sounds, wheezing, rhonchi or rales.  Abdominal:     General: Abdomen is flat.     Palpations: Abdomen is soft.  Skin:    General: Skin is warm and dry.  Neurological:     General: No focal deficit present.  Psychiatric:        Mood and Affect: Mood normal.     ED Results / Procedures / Treatments   Labs (all labs ordered are listed, but only abnormal results are displayed) Labs Reviewed  BASIC METABOLIC PANEL - Abnormal; Notable for the following components:      Result Value   Potassium 3.3 (*)    Glucose, Bld 119 (*)    Calcium 8.6 (*)    GFR, Estimated 58 (*)    All other components within normal limits  CBC - Abnormal; Notable for the following components:   RBC 3.22 (*)    Hemoglobin 10.2 (*)    HCT 32.5 (*)    MCV 100.9 (*)    All other components within normal limits  RESP PANEL BY RT-PCR (FLU A&B, COVID) [id number]  TROPONIN I (HIGH SENSITIVITY)  TROPONIN I (HIGH SENSITIVITY)    EKG None  Radiology DG Chest 2 View  Result Date: 04/04/2022 CLINICAL DATA:  Shortness of breath. EXAM: CHEST - 2 VIEW COMPARISON:  02/04/2022 FINDINGS: Lungs are hyperexpanded. Interstitial markings are diffusely coarsened with chronic features. No focal airspace consolidation or pleural effusion. Nodular density/densities projecting over the lungs are compatible with pads for telemetry leads. Cardiopericardial silhouette is at upper limits of normal for size. IMPRESSION: Emphysema without acute cardiopulmonary findings. Electronically Signed   By: Misty Stanley M.D.   On: 04/04/2022 15:38    Procedures Procedures    Medications Ordered in ED Medications   doxycycline (VIBRA-TABS) tablet 100 mg (has no administration in time range)  cefTRIAXone (ROCEPHIN) 2 g in sodium chloride 0.9 % 100 mL IVPB (has no administration in time range)    ED Course/ Medical Decision Making/ A&P                           Medical Decision Making Amount and/or Complexity of Data Reviewed Labs: ordered. Radiology: ordered.    Patient presented for shortness of breath.  On exam, lung sounds were clear with no adventitious sounds, maintaining high O2 sats on her home dose of 2 L of oxygen via nasal cannula.  Patient also stated that she was not short of breath at the time.  Patient also endorsed weeklong history of cough is productive with white-yellow sputum.  X-ray did not reveal concern for pneumonia but given her recent history and advanced age, I felt it warranted empiric treatment for community-acquired pneumonia.  Treated with ceftriaxone and doxycycline.  Advised patient to follow-up with her PCP.  Discussed return precautions.        Final Clinical Impression(s) / ED Diagnoses Final diagnoses:  Shortness of breath    Rx / DC Orders ED Discharge Orders          Ordered    doxycycline (VIBRAMYCIN) 100 MG capsule  2 times daily        04/04/22 1824              Gareth Eagle, PA-C 04/04/22 Aurora Mask, MD 04/06/22 1116

## 2022-04-04 NOTE — ED Triage Notes (Signed)
Pt c/o sob and congestion for the past week that has gotten worse. Cough is productive with yellow sputum production. Denies any fever.

## 2022-04-04 NOTE — Discharge Instructions (Signed)
Evaluation of your shortness of breath revealed that you may have a developing pneumonia given that you have had an ongoing cough with yellow and white sputum.  Will be treated with a 7-day course of doxycycline which is an antibiotic.  Please take the entire course and follow-up with your PCP.  If you have worsening shortness of breath, new chest pain, calf tenderness please return to the emergency department for further evaluation.

## 2022-12-16 IMAGING — CT CT ABD-PELV W/ CM
2 of 6 series · 17 of 46 positions shown, 19 images · IV contrast (Omnipaque or Isovue)
Comparison: None.

CLINICAL DATA: Abdominal pain, acute, nonlocalized; possible
impaction, ?early satiety

EXAM:
CT ABDOMEN AND PELVIS WITH CONTRAST
TECHNIQUE: Multidetector CT imaging of the abdomen and pelvis was performed
using the standard protocol following bolus administration of
intravenous contrast.

[Series 2: axial st · axial · 0.80mm/px · z∈[+1090,+1390]mm · 14 of 70 slices shown, 16 images]
[im 5/70  soft-tissue]
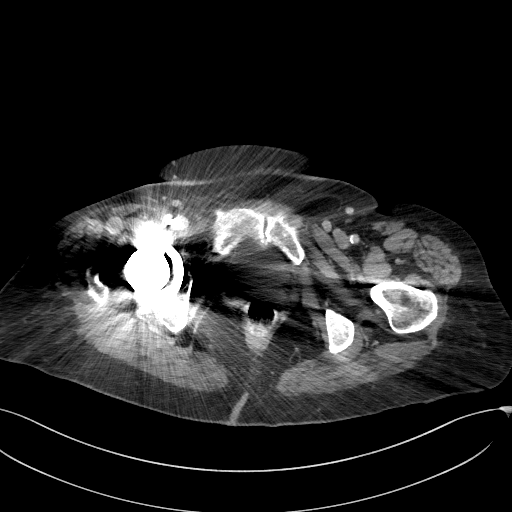
[im 5/70  bone]
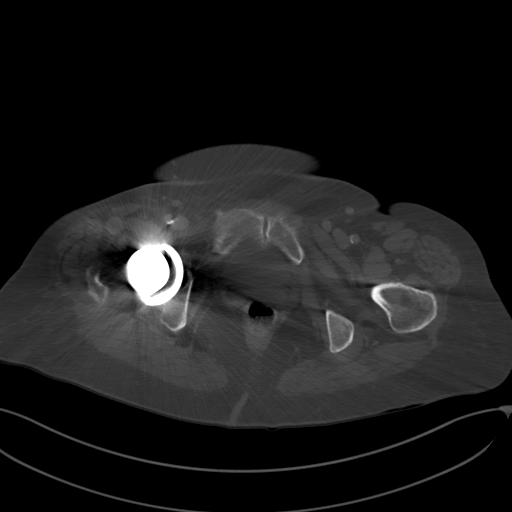
[im 9/70  soft-tissue]
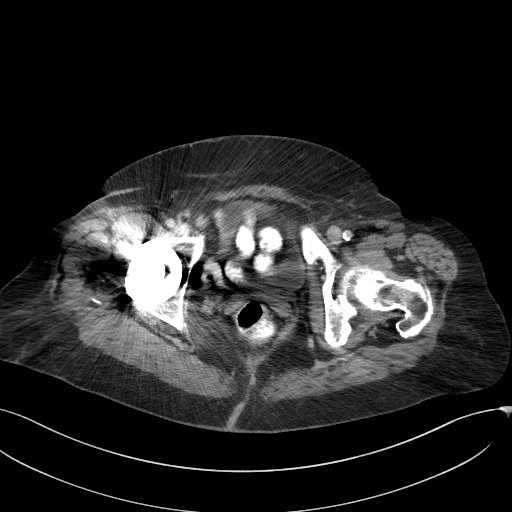
[im 13/70  soft-tissue]
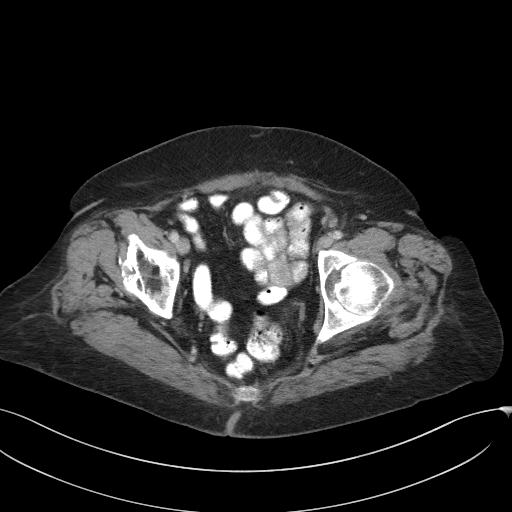
[im 18/70  soft-tissue]
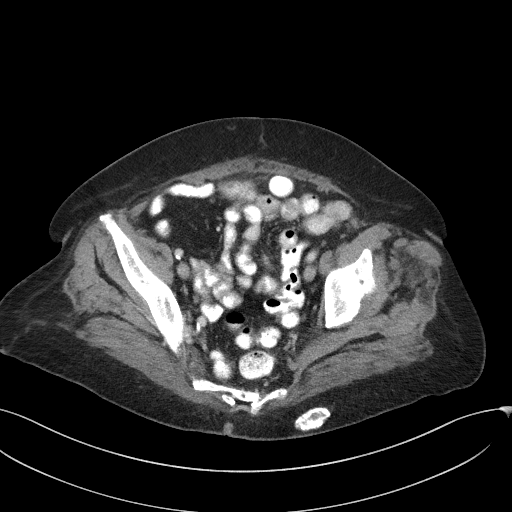
[im 22/70  soft-tissue]
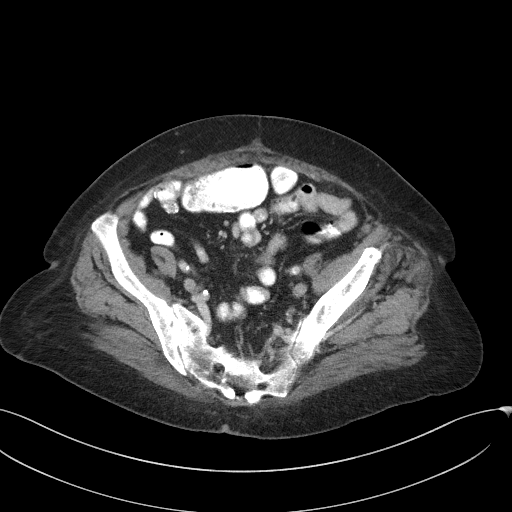
[im 26/70  soft-tissue]
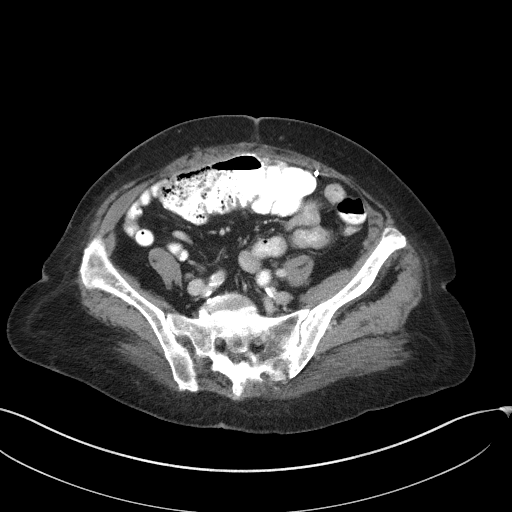
[im 31/70  soft-tissue]
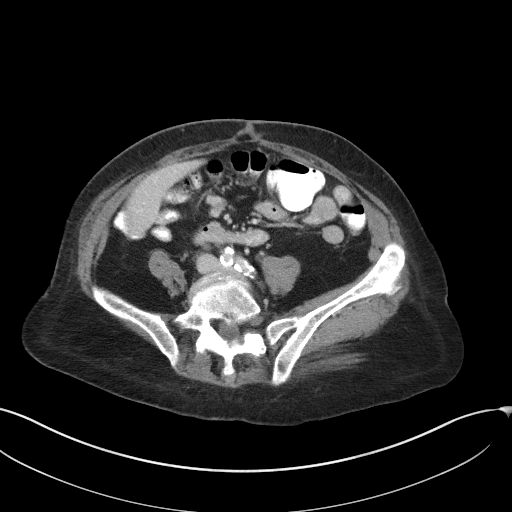
[im 39/70  soft-tissue]
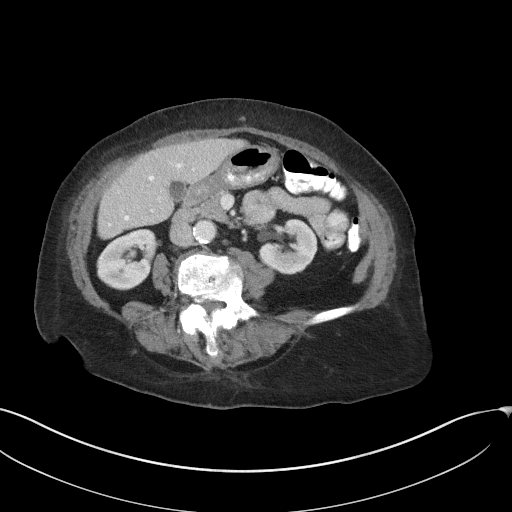
[im 44/70  soft-tissue]
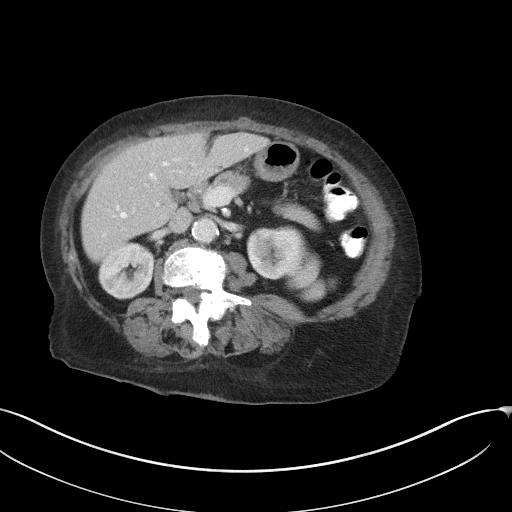
[im 44/70  bone]
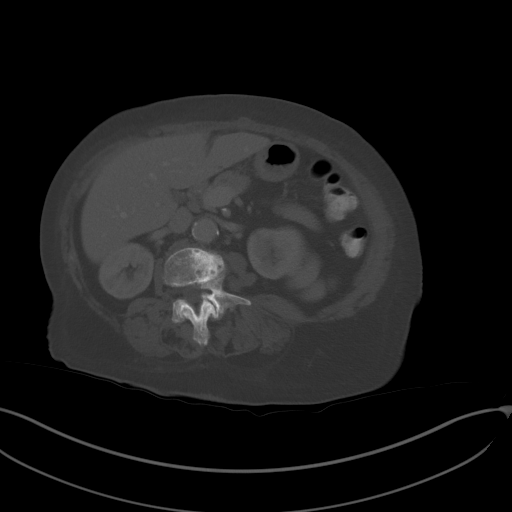
[im 48/70  soft-tissue]
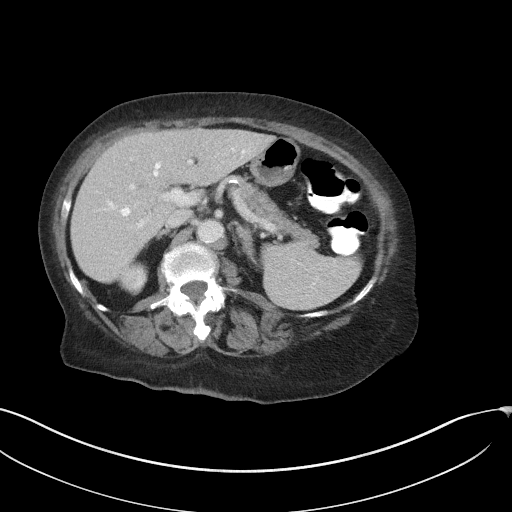
[im 52/70  soft-tissue]
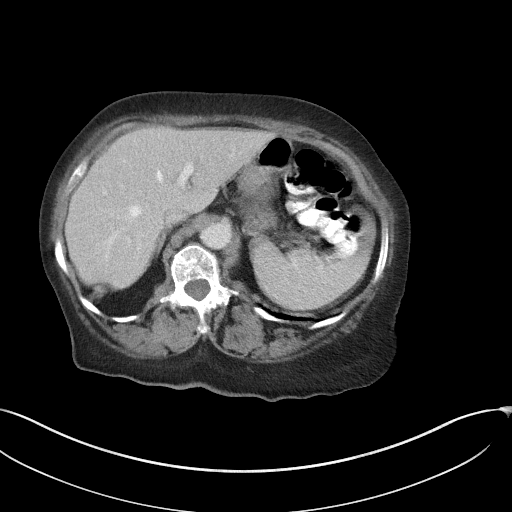
[im 57/70  soft-tissue]
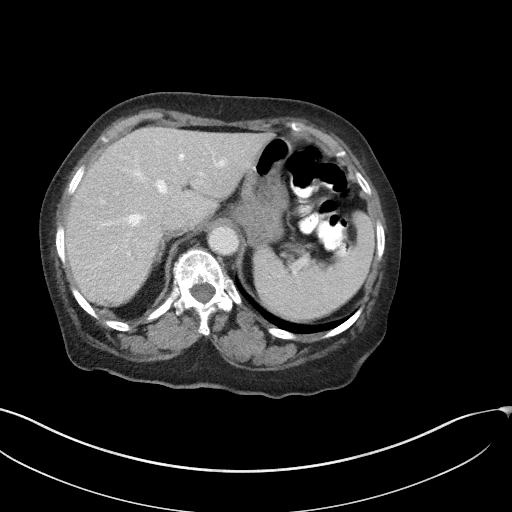
[im 61/70  soft-tissue]
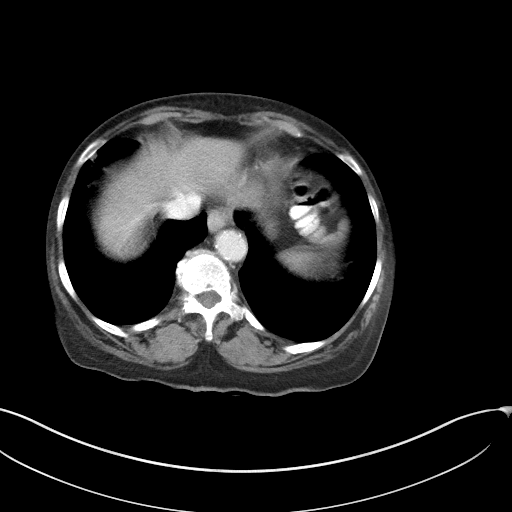
[im 65/70  soft-tissue]
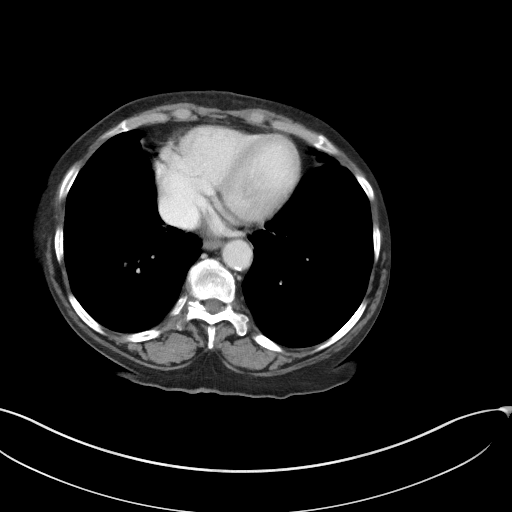

[Series 7: coronal st · coronal · 0.68mm/px · 3 of 115 slices shown]
[im 39/115  soft-tissue]
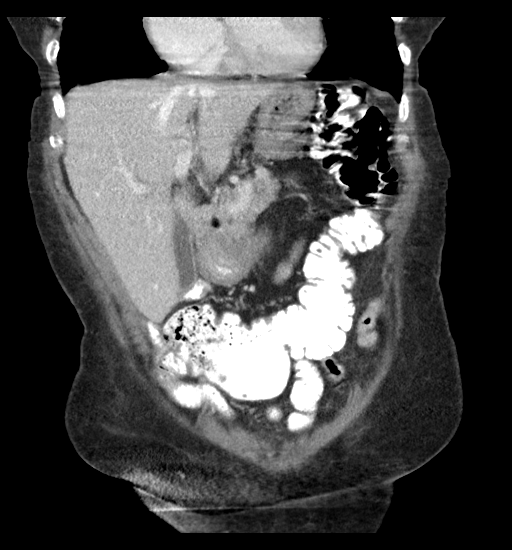
[im 51/115  soft-tissue]
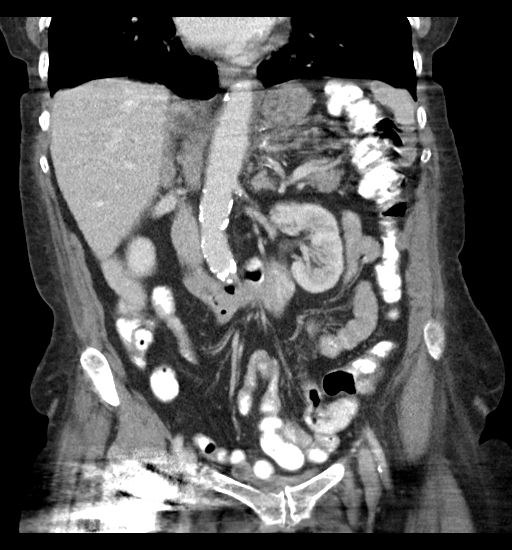
[im 64/115  soft-tissue]
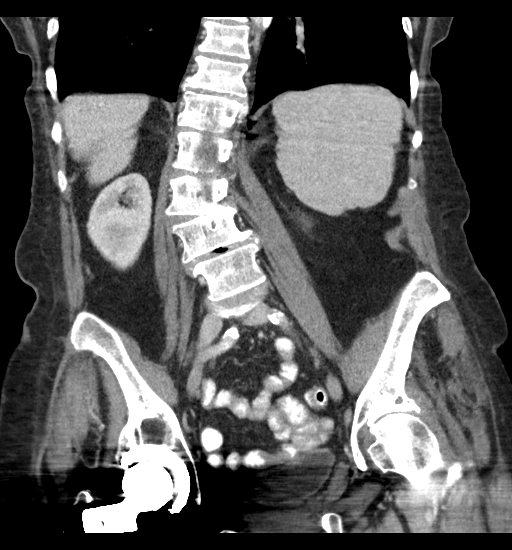

[17 of 46 positions shown; findings below may reference images not displayed]

RADIATION DOSE REDUCTION: This exam was performed according to the
departmental dose-optimization program which includes automated
exposure control, adjustment of the mA and/or kV according to
patient size and/or use of iterative reconstruction technique.

CONTRAST:  75mL OMNIPAQUE IOHEXOL 300 MG/ML  SOLN
FINDINGS: Motion artifact is present. Below findings are within this
limitation.

Lower chest: No acute abnormality.

Hepatobiliary: No focal liver abnormality is seen. No gallstones,
gallbladder wall thickening, or biliary dilatation.

Pancreas: Unremarkable.

Spleen: Unremarkable.

Adrenals/Urinary Tract: Adrenals are unremarkable. Kidneys are
unremarkable. The bladder is obscured by streak artifact.

Stomach/Bowel: Stomach is within normal limits. Post right
hemicolectomy. Bowel is normal in caliber. Stool burden is mild.

Vascular/Lymphatic: Diffuse atherosclerosis.  No enlarged nodes.

Reproductive: Status post hysterectomy. No adnexal masses.

Other: No free fluid.  No acute abnormality of the abdominal wall.

Musculoskeletal: Right total hip arthroplasty with associated streak
artifact. Degenerative changes of the included spine superimposed on
dextroscoliosis. Degenerative changes of the left hip.
IMPRESSION: Degraded by motion artifact.

No acute abnormality.

Right hemicolectomy.  Stool burden is mild.

## 2023-10-26 ENCOUNTER — Other Ambulatory Visit: Payer: Self-pay

## 2023-10-26 ENCOUNTER — Encounter (HOSPITAL_COMMUNITY): Payer: Self-pay

## 2023-10-26 ENCOUNTER — Emergency Department (HOSPITAL_COMMUNITY): Payer: Medicare (Managed Care)

## 2023-10-26 ENCOUNTER — Inpatient Hospital Stay (HOSPITAL_COMMUNITY)
Admission: EM | Admit: 2023-10-26 | Discharge: 2023-10-28 | DRG: 189 | Disposition: A | Discharge status: Home / Self Care | Payer: Medicare (Managed Care) | Attending: Internal Medicine | Admitting: Internal Medicine

## 2023-10-26 DIAGNOSIS — D519 Vitamin B12 deficiency anemia, unspecified: Secondary | ICD-10-CM | POA: Diagnosis present

## 2023-10-26 DIAGNOSIS — I5032 Chronic diastolic (congestive) heart failure: Secondary | ICD-10-CM | POA: Diagnosis present

## 2023-10-26 DIAGNOSIS — J441 Chronic obstructive pulmonary disease with (acute) exacerbation: Principal | ICD-10-CM | POA: Diagnosis present

## 2023-10-26 DIAGNOSIS — Z87891 Personal history of nicotine dependence: Secondary | ICD-10-CM | POA: Diagnosis not present

## 2023-10-26 DIAGNOSIS — I110 Hypertensive heart disease with heart failure: Secondary | ICD-10-CM | POA: Diagnosis present

## 2023-10-26 DIAGNOSIS — R0609 Other forms of dyspnea: Secondary | ICD-10-CM | POA: Diagnosis not present

## 2023-10-26 DIAGNOSIS — J439 Emphysema, unspecified: Secondary | ICD-10-CM | POA: Diagnosis present

## 2023-10-26 DIAGNOSIS — J9621 Acute and chronic respiratory failure with hypoxia: Secondary | ICD-10-CM | POA: Diagnosis present

## 2023-10-26 DIAGNOSIS — Z9981 Dependence on supplemental oxygen: Secondary | ICD-10-CM

## 2023-10-26 DIAGNOSIS — Z7984 Long term (current) use of oral hypoglycemic drugs: Secondary | ICD-10-CM | POA: Diagnosis not present

## 2023-10-26 DIAGNOSIS — Z91199 Patient's noncompliance with other medical treatment and regimen due to unspecified reason: Secondary | ICD-10-CM | POA: Diagnosis not present

## 2023-10-26 DIAGNOSIS — I10 Essential (primary) hypertension: Secondary | ICD-10-CM | POA: Diagnosis not present

## 2023-10-26 DIAGNOSIS — E785 Hyperlipidemia, unspecified: Secondary | ICD-10-CM | POA: Diagnosis present

## 2023-10-26 DIAGNOSIS — Z7983 Long term (current) use of bisphosphonates: Secondary | ICD-10-CM

## 2023-10-26 HISTORY — DX: Hyperlipidemia, unspecified: E78.5

## 2023-10-26 LAB — BRAIN NATRIURETIC PEPTIDE: B Natriuretic Peptide: 397 pg/mL — ABNORMAL HIGH (ref 0.0–100.0)

## 2023-10-26 LAB — COMPREHENSIVE METABOLIC PANEL WITH GFR
ALT: 18 U/L (ref 0–44)
AST: 22 U/L (ref 15–41)
Albumin: 3.7 g/dL (ref 3.5–5.0)
Alkaline Phosphatase: 48 U/L (ref 38–126)
Anion gap: 6 (ref 5–15)
BUN: 26 mg/dL — ABNORMAL HIGH (ref 8–23)
CO2: 30 mmol/L (ref 22–32)
Calcium: 9.1 mg/dL (ref 8.9–10.3)
Chloride: 101 mmol/L (ref 98–111)
Creatinine, Ser: 1.01 mg/dL — ABNORMAL HIGH (ref 0.44–1.00)
GFR, Estimated: 55 mL/min — ABNORMAL LOW (ref >60)
Glucose, Bld: 120 mg/dL — ABNORMAL HIGH (ref 70–99)
Potassium: 3.9 mmol/L (ref 3.5–5.1)
Sodium: 137 mmol/L (ref 135–145)
Total Bilirubin: 0.7 mg/dL (ref 0.0–1.2)
Total Protein: 6.8 g/dL (ref 6.5–8.1)

## 2023-10-26 LAB — CBC WITH DIFFERENTIAL/PLATELET
Abs Immature Granulocytes: 0.01 10*3/uL (ref 0.00–0.07)
Basophils Absolute: 0 10*3/uL (ref 0.0–0.1)
Basophils Relative: 1 %
Eosinophils Absolute: 0.1 10*3/uL (ref 0.0–0.5)
Eosinophils Relative: 2 %
HCT: 34.1 % — ABNORMAL LOW (ref 36.0–46.0)
Hemoglobin: 10.4 g/dL — ABNORMAL LOW (ref 12.0–15.0)
Immature Granulocytes: 0 %
Lymphocytes Relative: 18 %
Lymphs Abs: 1.1 10*3/uL (ref 0.7–4.0)
MCH: 32.3 pg (ref 26.0–34.0)
MCHC: 30.5 g/dL (ref 30.0–36.0)
MCV: 105.9 fL — ABNORMAL HIGH (ref 80.0–100.0)
Monocytes Absolute: 0.5 10*3/uL (ref 0.1–1.0)
Monocytes Relative: 9 %
Neutro Abs: 4.4 10*3/uL (ref 1.7–7.7)
Neutrophils Relative %: 70 %
Platelets: 187 10*3/uL (ref 150–400)
RBC: 3.22 MIL/uL — ABNORMAL LOW (ref 3.87–5.11)
RDW: 13.3 % (ref 11.5–15.5)
WBC: 6.2 10*3/uL (ref 4.0–10.5)
nRBC: 0 % (ref 0.0–0.2)

## 2023-10-26 LAB — D-DIMER, QUANTITATIVE: D-Dimer, Quant: 1.07 ug{FEU}/mL — ABNORMAL HIGH (ref 0.00–0.50)

## 2023-10-26 LAB — TROPONIN I (HIGH SENSITIVITY): Troponin I (High Sensitivity): 15 ng/L (ref <18)

## 2023-10-26 MED ORDER — METHYLPREDNISOLONE SODIUM SUCC 40 MG IJ SOLR
40.0000 mg | INTRAMUSCULAR | Status: DC
  Administered 2023-10-26 – 2023-10-27 (×2): 40 mg via INTRAVENOUS
  Filled 2023-10-26 (×2): qty 1

## 2023-10-26 MED ORDER — ENOXAPARIN SODIUM 40 MG/0.4ML IJ SOSY
40.0000 mg | PREFILLED_SYRINGE | INTRAMUSCULAR | Status: DC
  Administered 2023-10-26 – 2023-10-27 (×2): 40 mg via SUBCUTANEOUS
  Filled 2023-10-26 (×2): qty 0.4

## 2023-10-26 MED ORDER — EMPAGLIFLOZIN 10 MG PO TABS
10.0000 mg | ORAL_TABLET | Freq: Every day | ORAL | Status: DC
  Administered 2023-10-27 – 2023-10-28 (×2): 10 mg via ORAL
  Filled 2023-10-26 (×2): qty 1

## 2023-10-26 MED ORDER — IPRATROPIUM-ALBUTEROL 0.5-2.5 (3) MG/3ML IN SOLN: 3.0000 mL | Freq: Four times a day (QID) | RESPIRATORY_TRACT | Status: DC

## 2023-10-26 MED ORDER — AZELASTINE HCL 0.1 % NA SOLN
1.0000 | Freq: Two times a day (BID) | NASAL | Status: DC
  Administered 2023-10-27: 1 via NASAL
  Filled 2023-10-26: qty 30

## 2023-10-26 MED ORDER — ALBUTEROL SULFATE (2.5 MG/3ML) 0.083% IN NEBU: 2.5000 mg | INHALATION_SOLUTION | RESPIRATORY_TRACT | Status: DC | PRN

## 2023-10-26 MED ORDER — OMEGA-3-ACID ETHYL ESTERS 1 G PO CAPS
1.0000 g | ORAL_CAPSULE | Freq: Every day | ORAL | Status: DC
  Administered 2023-10-27 – 2023-10-28 (×2): 1 g via ORAL
  Filled 2023-10-26 (×2): qty 1

## 2023-10-26 MED ORDER — PREDNISONE 20 MG PO TABS
40.0000 mg | ORAL_TABLET | Freq: Once | ORAL | Status: AC
  Administered 2023-10-26: 40 mg via ORAL
  Filled 2023-10-26: qty 2

## 2023-10-26 MED ORDER — OMEGA-3 1000 MG PO CAPS: 1000.0000 mg | ORAL_CAPSULE | Freq: Every day | ORAL | Status: DC

## 2023-10-26 MED ORDER — AZITHROMYCIN 250 MG PO TABS
250.0000 mg | ORAL_TABLET | ORAL | Status: DC
  Administered 2023-10-28: 250 mg via ORAL
  Filled 2023-10-26: qty 1

## 2023-10-26 MED ORDER — IPRATROPIUM-ALBUTEROL 0.5-2.5 (3) MG/3ML IN SOLN
3.0000 mL | Freq: Three times a day (TID) | RESPIRATORY_TRACT | Status: DC
  Administered 2023-10-27 – 2023-10-28 (×5): 3 mL via RESPIRATORY_TRACT
  Filled 2023-10-26 (×5): qty 3

## 2023-10-26 MED ORDER — LORATADINE 10 MG PO TABS
10.0000 mg | ORAL_TABLET | Freq: Every day | ORAL | Status: DC
  Administered 2023-10-27 – 2023-10-28 (×2): 10 mg via ORAL
  Filled 2023-10-26 (×2): qty 1

## 2023-10-26 MED ORDER — DOXYCYCLINE HYCLATE 100 MG PO TABS
100.0000 mg | ORAL_TABLET | Freq: Once | ORAL | Status: AC
  Administered 2023-10-26: 100 mg via ORAL
  Filled 2023-10-26: qty 1

## 2023-10-26 MED ORDER — HYDROCODONE-ACETAMINOPHEN 7.5-325 MG PO TABS: 1.0000 | ORAL_TABLET | Freq: Two times a day (BID) | ORAL | Status: DC | PRN

## 2023-10-26 MED ORDER — IPRATROPIUM-ALBUTEROL 0.5-2.5 (3) MG/3ML IN SOLN
6.0000 mL | Freq: Once | RESPIRATORY_TRACT | Status: AC
  Administered 2023-10-26: 6 mL via RESPIRATORY_TRACT
  Filled 2023-10-26: qty 3

## 2023-10-26 MED ORDER — ATORVASTATIN CALCIUM 40 MG PO TABS
80.0000 mg | ORAL_TABLET | Freq: Every day | ORAL | Status: DC
  Administered 2023-10-27 – 2023-10-28 (×2): 80 mg via ORAL
  Filled 2023-10-26 (×2): qty 2

## 2023-10-26 MED ORDER — IPRATROPIUM-ALBUTEROL 0.5-2.5 (3) MG/3ML IN SOLN
3.0000 mL | Freq: Once | RESPIRATORY_TRACT | Status: AC
  Administered 2023-10-26: 3 mL via RESPIRATORY_TRACT
  Filled 2023-10-26: qty 3

## 2023-10-26 MED ORDER — BENAZEPRIL HCL 20 MG PO TABS
20.0000 mg | ORAL_TABLET | Freq: Every day | ORAL | Status: DC
  Administered 2023-10-27 – 2023-10-28 (×2): 20 mg via ORAL
  Filled 2023-10-26 (×2): qty 1

## 2023-10-26 MED ORDER — BUDESONIDE 0.25 MG/2ML IN SUSP
0.2500 mg | Freq: Two times a day (BID) | RESPIRATORY_TRACT | Status: DC
  Administered 2023-10-26 – 2023-10-28 (×4): 0.25 mg via RESPIRATORY_TRACT
  Filled 2023-10-26 (×4): qty 2

## 2023-10-26 MED ORDER — LATANOPROST 0.005 % OP SOLN
1.0000 [drp] | Freq: Every day | OPHTHALMIC | Status: DC
  Administered 2023-10-26 – 2023-10-27 (×2): 1 [drp] via OPHTHALMIC
  Filled 2023-10-26: qty 2.5

## 2023-10-26 MED ORDER — LEVOCETIRIZINE DIHYDROCHLORIDE 5 MG PO TABS: 5.0000 mg | ORAL_TABLET | Freq: Every day | ORAL | Status: DC

## 2023-10-26 MED ORDER — FUROSEMIDE 40 MG PO TABS
40.0000 mg | ORAL_TABLET | Freq: Every day | ORAL | Status: DC
  Administered 2023-10-27 – 2023-10-28 (×2): 40 mg via ORAL
  Filled 2023-10-26 (×2): qty 1

## 2023-10-26 NOTE — ED Triage Notes (Signed)
 Pt arrived via POV following an appointment at her cardiologists office earlier today who reports Pts O2 on 2L Nasal Cannula was in the 60's and 70's. Pt reports being on 2L Nasal Cannula at baseline. Pts O2 in the high 80's today in Triage but increased to 91% when Pt stopped answering questions. Pt also reports being told she has been wheezing recently.

## 2023-10-26 NOTE — ED Notes (Signed)
 Nurse calling lab to check on status of results of bnp. Stated it is running , it is taking a awhile.

## 2023-10-26 NOTE — H&P (Addendum)
 History and Physical    Patient: Mia Howe ZOX:[id number] DOB: 04/23/1941 DOA: 10/26/2023 DOS: the patient was seen and examined on 10/26/2023 PCP: Veda Gerald, MD  Patient coming from: Home  Chief Complaint:  Chief Complaint  Patient presents with   Shortness of Breath   HPI: Mia Howe is a 83 y.o. female with medical history significant of COPD, heart failure, hypertension and hyperlipidemia who presented with dyspnea.  Reported having dyspnea on exertion for several days, worsening in nature and associated with wheezing.  Yesterday her daughter called EMS, and she was found hypoxic. She was offered to be transported to the ED but she declined. Today she was seen at her cardiology office, her 02 saturation was 60 to 70% on supplemental 02 per [place] 2L/min, then she was referred to the ED for further evaluation.   Patient has supplemental 02 at home uses 3 L/min and uses daily trilogy. She does not use nebulized albuterol often and she has not been adherent to her supplemental 02 at home.  She ambulates with no need of walker or cane.   She denies orthopnea, lower extremity edema or PND. No fever or chills, no worsening cough or increased sputum production.   At the time of my examination her dyspnea is moderate in intensity, she continues to have some wheezing, symptoms worse with exertion and improved with bronchodilator therapy   Review of Systems: As mentioned in the history of present illness. All other systems reviewed and are negative. Past Medical History:  Diagnosis Date   CHF (congestive heart failure) (HCC)    COPD (chronic obstructive pulmonary disease) (HCC)    HTN (hypertension)    Hyperlipidemia    Pneumonia    Past Surgical History:  Procedure Laterality Date   BIOPSY  06/12/2021   Procedure: BIOPSY;  Surgeon: Umberto Ganong, Bearl Limes, MD;  Location: [facility];  Service: Gastroenterology;;   COLECTOMY     removal of large polyp   COLONOSCOPY  WITH PROPOFOL  N/A 06/12/2021   Procedure: COLONOSCOPY WITH PROPOFOL ;  Surgeon: Urban Garden, MD;  Location: [facility];  Service: Gastroenterology;  Laterality: N/A;  730 RM 1 / no pac req   ESOPHAGOGASTRODUODENOSCOPY (EGD) WITH PROPOFOL   06/12/2021   Procedure: ESOPHAGOGASTRODUODENOSCOPY (EGD) WITH PROPOFOL ;  Surgeon: Urban Garden, MD;  Location: [facility];  Service: Gastroenterology;;   HERNIA REPAIR     ventral hernia repair   HIP SURGERY Right    POLYPECTOMY  06/12/2021   Procedure: POLYPECTOMY;  Surgeon: Urban Garden, MD;  Location: [facility];  Service: Gastroenterology;;   TOTAL ABDOMINAL HYSTERECTOMY     Social History:  reports that she quit smoking about 33 years ago. Her smoking use included cigarettes. She started smoking about 53 years ago. She has a 5 pack-year smoking history. She has been exposed to tobacco smoke. She has never used smokeless tobacco. She reports that she does not drink alcohol and does not use drugs.  Allergies  Allergen Reactions   Levofloxacin Dermatitis and Rash    Family History  Problem Relation Age of Onset   Colon cancer Mother    Sarcoidosis Father     Prior to Admission medications   Medication Sig Start Date End Date Taking? Authorizing Provider  albuterol (PROVENTIL) (2.5 MG/3ML) 0.083% nebulizer solution Take 2.5 mg by nebulization every 4 (four) hours as needed for wheezing or shortness of breath. 11/07/20  Yes [provider]  albuterol (VENTOLIN HFA) 108 (90  Base) MCG/ACT inhaler 2 puffs every 4 (four) hours as needed for wheezing or shortness of breath. 10/29/98  Yes [provider]  alendronate (FOSAMAX) 70 MG tablet Take 70 mg by mouth once a week. 06/11/19  Yes [provider]  atorvastatin (LIPITOR) 80 MG tablet Take 80 mg by mouth daily. 02/06/22  Yes [provider]  azelastine (ASTELIN) 0.1 % nasal spray Place 1 spray into both nostrils 2 (two) times daily.  Use in each nostril as directed   Yes [provider]  azithromycin (ZITHROMAX) 250 MG tablet Take 250 mg by mouth 3 (three) times a week. 10/17/23  Yes [provider]  benazepril (LOTENSIN) 20 MG tablet Take 20 mg by mouth daily. 06/12/19  Yes [provider]  BIOTIN PO Take 1 tablet by mouth daily.   Yes [provider]  Calcium-Magnesium-Vitamin D (CALCIUM MAGNESIUM PO) Take 1 tablet by mouth 2 (two) times daily.   Yes [provider]  empagliflozin (JARDIANCE) 10 MG TABS tablet Take 10 mg by mouth daily.   Yes [provider]  Fluticasone-Umeclidin-Vilant (TRELEGY ELLIPTA) 100-62.5-25 MCG/ACT AEPB Take 1 puff by mouth daily. 06/12/19  Yes [provider]  furosemide (LASIX) 40 MG tablet Take 40 mg by mouth daily. 09/11/13  Yes [provider]  HYDROcodone-acetaminophen (NORCO) 7.5-325 MG tablet Take 1 tablet by mouth 2 (two) times daily as needed for moderate pain (pain score 4-6). 01/28/21  Yes [provider]  latanoprost (XALATAN) 0.005 % ophthalmic solution Place 1 drop into both eyes at bedtime. 10/03/13  Yes [provider]  levocetirizine (XYZAL) 5 MG tablet Take 5 mg by mouth daily. 10/28/20  Yes [provider]  Omega-3 1000 MG CAPS Take 1,000 mg by mouth daily at 6 (six) AM. XL   Yes [provider]  OXYGEN  Inhale 2-3 L into the lungs continuous.   Yes [provider]    Physical Exam: Vitals:   10/26/23 1956 10/26/23 2000 10/26/23 2018 10/26/23 2030  BP:  (!) 146/69  116/85  Pulse:  85  (!) 101  Resp:  17  (!) 22  Temp:   98.1 F (36.7 C)   TempSrc:   Oral   SpO2: 97% 100%  92%  Weight:      Height:       Neurology awake and alert ENT with mild pallor Cardiovascular with S1 and S2 present and regular with no gallops, rubs or murmurs Respiratory with prolonged expiratory phase with expiratory wheezing, no with rhonchi or rales.  No accessory respiratory muscle  use Abdomen with no distention, soft and non tender No lower extremity edema.   Data Reviewed:   Na 137, K 3,9 Cl 101, bicarbonate 30 bun 26 cr 1,0  AST 22 ALT 18  BNP 397  High sensitive troponin 15  Wbc 6,2 hgb 10,4 plt 187   Chest radiograph hyperinflation with mild cardiomegaly, increased bilateral lung marking with no effusions or infiltrates.   EKG 82 bpm, left axis deviation, left anterior fascicular block, qtc 463, sinus rhythm with left atrial enlargement, positive PVC, with no significant ST segment or  T wave changes.   Assessment and Plan: * COPD exacerbation (HCC) Acute on chronic hypoxemic respiratory failure.   Plan to continue aggressive bronchodilator therapy with albuterol and ipratropium Inhaled and systemic corticosteroids Airway clearing techniques with flutter valve and incentive spirometer.  Old records personally reviewed CT chest 2023 with advance emphysema. (Care everywhere)  Continue with three times per  week azithromycin   Will check a d dimer, if positive will plan for CT chest angiography.   Essential hypertension Continue blood pressure control with benazepril.  Close blood pressure monitoring   Chronic diastolic CHF (congestive heart failure) (HCC) Echocardiogram 2023 with preserved LV systolic function.   Plan to continue benazepril, SGLT 2 inh and diuresis with furosemide Patient with no signs of acute heart failure decompensation Will check echocardiogram.   Dyslipidemia Continue with statin therapy       Advance Care Planning:   Code Status: Full Code   Consults: none   Family Communication: I spoke with patient's daughters at the bedside, we talked in detail about patient's condition, plan of care and prognosis and all questions were addressed.   Severity of Illness: The appropriate patient status for this patient is INPATIENT. Inpatient status is judged to be reasonable and necessary in order to provide the required intensity  of service to ensure the patient's safety. The patient's presenting symptoms, physical exam findings, and initial radiographic and laboratory data in the context of their chronic comorbidities is felt to place them at high risk for further clinical deterioration. Furthermore, it is not anticipated that the patient will be medically stable for discharge from the hospital within 2 midnights of admission.   * I certify that at the point of admission it is my clinical judgment that the patient will require inpatient hospital care spanning beyond 2 midnights from the point of admission due to high intensity of service, high risk for further deterioration and high frequency of surveillance required.*  Author: Albertus Alt, MD 10/26/2023 8:59 PM  For on call review [email or web address].

## 2023-10-26 NOTE — Assessment & Plan Note (Signed)
 Echocardiogram 2023 with preserved LV systolic function.   Plan to continue benazepril, SGLT 2 inh and diuresis with furosemide Patient with no signs of acute heart failure decompensation Will check echocardiogram.

## 2023-10-26 NOTE — ED Notes (Signed)
 Pt requested to walk to the  bathroom without oxygen  on. Pt and daughter stated she does it at home. Nurse took pt to bathroom and safety assisted pt and upon return to the room pt was in the 60's. Pt then taught importance of leaving oxygen  on at all times.

## 2023-10-26 NOTE — Assessment & Plan Note (Signed)
 Continue with statin therapy.  ?

## 2023-10-26 NOTE — Plan of Care (Signed)

## 2023-10-26 NOTE — Assessment & Plan Note (Signed)
 Continue blood pressure control with benazepril.  Close blood pressure monitoring

## 2023-10-26 NOTE — ED Provider Notes (Signed)
 [place] EMERGENCY DEPARTMENT AT [facility] Provider Note   CSN: [id number] Arrival date & time: 10/26/23  1600     History  Chief Complaint  Patient presents with   Shortness of Breath    Mia Howe is a 83 y.o. female.  83 year old female with a history of COPD and CHF who presents to the emergency department with shortness of breath and hypoxia.  Patient reports that yesterday she had a transient episode of shortness of breath.  When paramedics arrived her oxygen  saturations were in the 60s and then improved without any sort of intervention.  Declined transport to the hospital.  Mia Howe to cardiology today and said that she felt somewhat winded while she was walking into the cardiologist office and they noticed that her oxygen  levels were in the 70s.  They also noticed significant wheezing and referred her to the emergency department.  At this point in time she denies any significant shortness of breath to me.  No chest pain.  No recent infectious symptoms.  Has had some mild leg swelling bilaterally.       Home Medications Prior to Admission medications   Medication Sig Start Date End Date Taking? Authorizing Provider  albuterol (PROVENTIL) (2.5 MG/3ML) 0.083% nebulizer solution Take 2.5 mg by nebulization every 4 (four) hours as needed for wheezing or shortness of breath. 11/07/20  Yes [provider]  albuterol (VENTOLIN HFA) 108 (90 Base) MCG/ACT inhaler 2 puffs every 4 (four) hours as needed for wheezing or shortness of breath. 10/29/98  Yes [provider]  alendronate (FOSAMAX) 70 MG tablet Take 70 mg by mouth once a week. 06/11/19  Yes [provider]  atorvastatin (LIPITOR) 80 MG tablet Take 80 mg by mouth daily. 02/06/22  Yes [provider]  azelastine (ASTELIN) 0.1 % nasal spray Place 1 spray into both nostrils 2 (two) times daily. Use in each nostril as directed   Yes [provider]  azithromycin (ZITHROMAX) 250 MG  tablet Take 250 mg by mouth 3 (three) times a week. 10/17/23  Yes [provider]  benazepril (LOTENSIN) 20 MG tablet Take 20 mg by mouth daily. 06/12/19  Yes [provider]  BIOTIN PO Take 1 tablet by mouth daily.   Yes [provider]  Calcium-Magnesium-Vitamin D (CALCIUM MAGNESIUM PO) Take 1 tablet by mouth 2 (two) times daily.   Yes [provider]  empagliflozin (JARDIANCE) 10 MG TABS tablet Take 10 mg by mouth daily.   Yes [provider]  Fluticasone-Umeclidin-Vilant (TRELEGY ELLIPTA) 100-62.5-25 MCG/ACT AEPB Take 1 puff by mouth daily. 06/12/19  Yes [provider]  furosemide (LASIX) 40 MG tablet Take 40 mg by mouth daily. 09/11/13  Yes [provider]  HYDROcodone-acetaminophen (NORCO) 7.5-325 MG tablet Take 1 tablet by mouth 2 (two) times daily as needed for moderate pain (pain score 4-6). 01/28/21  Yes [provider]  latanoprost (XALATAN) 0.005 % ophthalmic solution Place 1 drop into both eyes at bedtime. 10/03/13  Yes [provider]  levocetirizine (XYZAL) 5 MG tablet Take 5 mg by mouth daily. 10/28/20  Yes [provider]  Omega-3 1000 MG CAPS Take 1,000 mg by mouth daily at 6 (six) AM. XL   Yes [provider]  OXYGEN  Inhale 2-3 L into the lungs continuous.   Yes [provider]      Allergies    Levofloxacin    Review of Systems   Review of Systems  Physical Exam Updated Vital  Signs BP (!) 152/66 (BP Location: Left Arm)   Pulse (!) 106   Temp 98.4 F (36.9 C) (Oral)   Resp 17   Ht 5\' 4"  (1.626 m)   Wt 65.3 kg   SpO2 98%   BMI 24.71 kg/m  Physical Exam Vitals and nursing note reviewed.  Constitutional:      General: She is not in acute distress.    Appearance: She is well-developed.     Comments: On 2 L satting 97%  HENT:     Head: Normocephalic and atraumatic.     Right Ear: External ear normal.     Left Ear: External ear normal.     Nose: Nose normal.   Eyes:     Extraocular Movements: Extraocular movements intact.     Conjunctiva/sclera: Conjunctivae normal.     Pupils: Pupils are equal, round, and reactive to light.  Cardiovascular:     Rate and Rhythm: Normal rate and regular rhythm.     Heart sounds: No murmur heard. Pulmonary:     Effort: Pulmonary effort is normal. No respiratory distress.     Breath sounds: Normal breath sounds.  Musculoskeletal:     Cervical back: Normal range of motion and neck supple.     Right lower leg: Edema (1+) present.     Left lower leg: Edema (1+) present.  Skin:    General: Skin is warm and dry.  Neurological:     Mental Status: She is alert and oriented to person, place, and time. Mental status is at baseline.  Psychiatric:        Mood and Affect: Mood normal.     ED Results / Procedures / Treatments   Labs (all labs ordered are listed, but only abnormal results are displayed) Labs Reviewed  CBC WITH DIFFERENTIAL/PLATELET - Abnormal; Notable for the following components:      Result Value   RBC 3.22 (*)    Hemoglobin 10.4 (*)    HCT 34.1 (*)    MCV 105.9 (*)    All other components within normal limits  COMPREHENSIVE METABOLIC PANEL WITH GFR - Abnormal; Notable for the following components:   Glucose, Bld 120 (*)    BUN 26 (*)    Creatinine, Ser 1.01 (*)    GFR, Estimated 55 (*)    All other components within normal limits  BRAIN NATRIURETIC PEPTIDE - Abnormal; Notable for the following components:   B Natriuretic Peptide 397.0 (*)    All other components within normal limits  D-DIMER, QUANTITATIVE - Abnormal; Notable for the following components:   D-Dimer, Quant 1.07 (*)    All other components within normal limits  BASIC METABOLIC PANEL WITH GFR  CBC  TROPONIN I (HIGH SENSITIVITY)    EKG None  Radiology DG Chest 2 View Result Date: 10/26/2023 CLINICAL DATA:  Shortness of breath EXAM: CHEST - 2 VIEW COMPARISON:  Chest x-ray 04/05/2019. FINDINGS: Heart is mildly  enlarged, unchanged. There are increased central interstitial markings similar to prior. There is no focal lung infiltrate, pleural effusion or pneumothorax identified. No acute fractures are seen. IMPRESSION: 1. Mild cardiomegaly. 2. Increased central interstitial markings similar to prior, likely chronic. Electronically Signed   By: Tyron Gallon M.D.   On: 10/26/2023 17:44    Procedures Procedures    Medications Ordered in ED Medications  enoxaparin (LOVENOX) injection 40 mg (40 mg Subcutaneous Given 10/26/23 2016)  HYDROcodone-acetaminophen (NORCO) 7.5-325 MG per tablet 1 tablet (has no administration in time range)  azithromycin (ZITHROMAX) tablet 250 mg (has no administration in time range)  atorvastatin (LIPITOR) tablet 80 mg (has no administration in time range)  benazepril (LOTENSIN) tablet 20 mg (has no administration in time range)  furosemide (LASIX) tablet 40 mg (has no administration in time range)  empagliflozin (JARDIANCE) tablet 10 mg (has no administration in time range)  azelastine (ASTELIN) 0.1 % nasal spray 1 spray (has no administration in time range)  latanoprost (XALATAN) 0.005 % ophthalmic solution 1 drop (1 drop Both Eyes Given 10/26/23 2220)  methylPREDNISolone sodium succinate (SOLU-MEDROL) 40 mg/mL injection 40 mg (40 mg Intravenous Given 10/26/23 2348)  albuterol (PROVENTIL) (2.5 MG/3ML) 0.083% nebulizer solution 2.5 mg (has no administration in time range)  budesonide (PULMICORT) nebulizer solution 0.25 mg (0.25 mg Nebulization Given 10/26/23 2241)  loratadine (CLARITIN) tablet 10 mg (has no administration in time range)  omega-3 acid ethyl esters (LOVAZA) capsule 1 g (has no administration in time range)  ipratropium-albuterol (DUONEB) 0.5-2.5 (3) MG/3ML nebulizer solution 3 mL (has no administration in time range)  ipratropium-albuterol (DUONEB) 0.5-2.5 (3) MG/3ML nebulizer solution 3 mL (3 mLs Nebulization Given 10/26/23 1726)  predniSONE (DELTASONE) tablet 40 mg  (40 mg Oral Given 10/26/23 1711)  ipratropium-albuterol (DUONEB) 0.5-2.5 (3) MG/3ML nebulizer solution 6 mL (6 mLs Nebulization Given 10/26/23 1953)  doxycycline  (VIBRA -TABS) tablet 100 mg (100 mg Oral Given 10/26/23 2015)    ED Course/ Medical Decision Making/ A&P Clinical Course as of 10/27/23 0151  Wed Oct 26, 2023  2002 Dr Emerick Hanlon from hospitalist to admit [RP]    Clinical Course User Index [RP] Ninetta Basket, MD                                 Medical Decision Making Amount and/or Complexity of Data Reviewed Labs: ordered. Radiology: ordered.  Risk Prescription drug management. Decision regarding hospitalization.   Mia Howe is a 83 y.o. female with comorbidities that complicate the patient evaluation including COPD and CHF who presents to the emergency department with shortness of breath and hypoxia.   Initial Ddx:  COPD, CHF, PE, MI  MDM/Course:  Patient presents to the emergency department with transient shortness of breath.  Also has been getting hypoxic at home.  Now has decreased exercise tolerance because of her shortness of breath.  Cardiology reported at her appointment earlier today that she was desaturating on her home oxygen  and had some wheezing.  On exam no significant wheezing currently.  Had chest x-ray that did not show acute findings as well as a EKG and troponin that are not suggestive of acute MI.  BNP is also marginally elevated with cardiomegaly on her x-ray.  Upon re-evaluation did have some wheezing.  Given her presentation I suspect that she likely is having mild COPD exacerbation.  Performed shared decision making with the family and they are requesting admission at this time so was discussed with hospitalist.  This patient presents to the ED for concern of complaints listed in HPI, this involves an extensive number of treatment options, and is a complaint that carries with it a high risk of complications and morbidity. Disposition including  potential need for admission considered.   Dispo: Admit to Floor  Additional history obtained from daughter Records reviewed Outpatient Clinic Notes The following labs were independently interpreted: Chemistry and show CKD I independently reviewed the following imaging with scope of interpretation limited to determining acute life threatening conditions related to  emergency care: Chest x-ray and agree with the radiologist interpretation with the following exceptions: none I personally reviewed and interpreted cardiac monitoring: normal sinus rhythm  I personally reviewed and interpreted the pt's EKG: see above for interpretation  I have reviewed the patients home medications and made adjustments as needed Consults: Hospitalist Social Determinants of health:  Geriatric  Portions of this note were generated with Scientist, clinical (histocompatibility and immunogenetics). Dictation errors may occur despite best attempts at proofreading.     Final Clinical Impression(s) / ED Diagnoses Final diagnoses:  COPD exacerbation [facility])    Rx / DC Orders ED Discharge Orders     None         Ninetta Basket, MD 10/27/23 [phone]

## 2023-10-26 NOTE — Assessment & Plan Note (Addendum)
 Acute on chronic hypoxemic respiratory failure.   Plan to continue aggressive bronchodilator therapy with albuterol and ipratropium Inhaled and systemic corticosteroids Airway clearing techniques with flutter valve and incentive spirometer.  Old records personally reviewed CT chest 2023 with advance emphysema. (Care everywhere)  Continue with three times per week azithromycin   Will check a d dimer, if positive will plan for CT chest angiography.

## 2023-10-26 NOTE — ED Notes (Signed)
 Patient transported to X-ray

## 2023-10-27 ENCOUNTER — Inpatient Hospital Stay (HOSPITAL_COMMUNITY): Payer: Medicare (Managed Care)

## 2023-10-27 DIAGNOSIS — J441 Chronic obstructive pulmonary disease with (acute) exacerbation: Secondary | ICD-10-CM | POA: Diagnosis not present

## 2023-10-27 DIAGNOSIS — R0609 Other forms of dyspnea: Secondary | ICD-10-CM | POA: Diagnosis not present

## 2023-10-27 LAB — ECHOCARDIOGRAM COMPLETE
AR max vel: 2.34 cm2
AV Area VTI: 2.73 cm2
AV Area mean vel: 2.08 cm2
AV Mean grad: 5 mmHg
AV Peak grad: 9.7 mmHg
Ao pk vel: 1.56 m/s
Area-P 1/2: 3.27 cm2
Calc EF: 61.5 %
Height: 64 in
MV VTI: 1.27 cm2
S' Lateral: 3.2 cm
Single Plane A2C EF: 62.8 %
Single Plane A4C EF: 58.5 %
Weight: 2303.37 [oz_av]

## 2023-10-27 LAB — BASIC METABOLIC PANEL WITH GFR
Anion gap: 8 (ref 5–15)
BUN: 23 mg/dL (ref 8–23)
CO2: 28 mmol/L (ref 22–32)
Calcium: 8.8 mg/dL — ABNORMAL LOW (ref 8.9–10.3)
Chloride: 101 mmol/L (ref 98–111)
Creatinine, Ser: 0.78 mg/dL (ref 0.44–1.00)
GFR, Estimated: >60 mL/min (ref >60)
Glucose, Bld: 163 mg/dL — ABNORMAL HIGH (ref 70–99)
Potassium: 4 mmol/L (ref 3.5–5.1)
Sodium: 137 mmol/L (ref 135–145)

## 2023-10-27 LAB — CBC
HCT: 32.3 % — ABNORMAL LOW (ref 36.0–46.0)
Hemoglobin: 9.7 g/dL — ABNORMAL LOW (ref 12.0–15.0)
MCH: 31.6 pg (ref 26.0–34.0)
MCHC: 30 g/dL (ref 30.0–36.0)
MCV: 105.2 fL — ABNORMAL HIGH (ref 80.0–100.0)
Platelets: 171 10*3/uL (ref 150–400)
RBC: 3.07 MIL/uL — ABNORMAL LOW (ref 3.87–5.11)
RDW: 13.3 % (ref 11.5–15.5)
WBC: 4.6 10*3/uL (ref 4.0–10.5)
nRBC: 0 % (ref 0.0–0.2)

## 2023-10-27 MED ORDER — IOHEXOL 350 MG/ML SOLN
75.0000 mL | Freq: Once | INTRAVENOUS | Status: AC | PRN
  Administered 2023-10-27: 75 mL via INTRAVENOUS

## 2023-10-27 NOTE — TOC CM/SW Note (Signed)
 Transition of Care [facility]) - Inpatient Brief Assessment   Patient Details  Name: Mia Howe MRN: [id number] Date of Birth: Apr 03, 1941  [facility]) CM/SW Contact:    Grandville Lax, LCSWA Phone Number: 10/27/2023, 12:10 PM   Clinical Narrative: Transition of Care Department [facility]) has reviewed patient and no TOC needs have been identified at this time. We will continue to monitor patient advancement through interdiciplinary progression rounds. If new patient transition needs arise, please place a TOC consult.   Transition of Care Asessment: Insurance and Status: Insurance coverage has been reviewed Patient has primary care physician: Yes Home environment has been reviewed: From home Prior level of function:: Independent Prior/Current Home Services: No current home services Social Drivers of Health Review: SDOH reviewed no interventions necessary Readmission risk has been reviewed: Yes Transition of care needs: no transition of care needs at this time

## 2023-10-27 NOTE — Plan of Care (Signed)
  Problem: Acute Rehab PT Goals(only PT should resolve) Goal: Pt Will Go Supine/Side To Sit Outcome: Progressing Flowsheets (Taken 10/27/2023 1509) Pt will go Supine/Side to Sit:  with modified independence  Independently Goal: Patient Will Transfer Sit To/From Stand Outcome: Progressing Flowsheets (Taken 10/27/2023 1509) Patient will transfer sit to/from stand:  with modified independence  with supervision Goal: Pt Will Transfer Bed To Chair/Chair To Bed Outcome: Progressing Flowsheets (Taken 10/27/2023 1509) Pt will Transfer Bed to Chair/Chair to Bed:  with modified independence  with supervision Goal: Pt Will Ambulate Outcome: Progressing Flowsheets (Taken 10/27/2023 1509) Pt will Ambulate:  100 feet  with modified independence  with supervision  with rolling walker   3:10 PM, 10/27/23 Walton Guppy, MPT Physical Therapist with [facility] [phone] office [phone] mobile phone

## 2023-10-27 NOTE — Progress Notes (Signed)
 PROGRESS NOTE  Mia Howe AVW:[id number] DOB: March 25, 1941 DOA: 10/26/2023 PCP: Veda Gerald, MD  HPI/Recap of past 24 hours: Mia Howe is a 83 y.o. female with medical history significant of COPD, heart failure, HTN, HLD, presented with worsening dyspnea for several days, associated with wheezing. EMS was activated, found to be hypoxic, but refused to go to the ER. PTA, she was seen at her cardiology office, her 02 saturation was 60 to 70% on supplemental 02 per [place] 2L/min. Patient was advised to go to the ED for further evaluation. Patient has not been compliant with her supplemental 02 at home (on 3 L/min) and uses daily trilogy.  In the ED, noted to be mildly tachycardic, sats 89% on 2 L, but increased to 97% on 3 L.  Labs notable for creatinine of 1, BNP 397, hemoglobin 10.4 currently around baseline.  Chest x-ray showed mild cardiomegaly, increased central interstitial marking, likely chronic.  Patient admitted for further management.   Today, patient denies any new complaints, still noted to be intermittently dyspneic with some mild bilateral wheezing.  Patient denies any chest pain, nausea/vomiting, fever/chills.         Assessment/Plan: Principal Problem:   COPD exacerbation (HCC) Active Problems:   Essential hypertension   Chronic diastolic CHF (congestive heart failure) (HCC)   Dyslipidemia   COPD exacerbation Acute on chronic hypoxemic respiratory failure Currently afebrile, with no leukocytosis Currently saturating well on baseline 3 L of O2 Chest x-ray with mild cardiomegaly, increased central interstitial marking, likely chronic DuoNebs, nebulizers, steroids, azithromycin Flutter valve and incentive spirometer Continue supplemental O2   Essential hypertension Continue benazepril   Chronic diastolic CHF (congestive heart failure) (HCC) BNP 397 Echocardiogram 2023 with preserved LV systolic function, repeat pending Continue benazepril,  Lasix  Macrocytic anemia Hemoglobin around baseline 10 Anemia panel pending Daily CBC  Dyslipidemia Continue with statin therapy      Estimated body mass index is 24.71 kg/m as calculated from the following:   Height as of this encounter: 5\' 4"  (1.626 m).   Weight as of this encounter: 65.3 kg.     Code Status: Full  Family Communication: None at bedside  Disposition Plan: Status is: Inpatient Remains inpatient appropriate because: Level of care      Consultants: None  Procedures: None  Antimicrobials: Azithromycin  DVT prophylaxis: Lovenox   Objective: Vitals:   10/26/23 2101 10/26/23 2243 10/27/23 0200 10/27/23 0544  BP: (!) 152/66  (!) 140/57 120/65  Pulse: (!) 106  78 65  Resp: 17  16 17   Temp: 98.4 F (36.9 C)  97.8 F (36.6 C) 97.8 F (36.6 C)  TempSrc: Oral  Oral Oral  SpO2: (!) 74% 98% 97% 97%  Weight:      Height:       No intake or output data in the 24 hours ending 10/27/23 0724 Filed Weights   10/26/23 1625  Weight: 65.3 kg    Exam: General: NAD  Cardiovascular: S1, S2 present Respiratory: Diminished breath sounds bilaterally, with mild wheezing noted Abdomen: Soft, nontender, nondistended, bowel sounds present Musculoskeletal: No bilateral pedal edema noted Skin: Normal Psychiatry: Normal mood     Data Reviewed: CBC: Recent Labs  Lab 10/26/23 1645 10/27/23 0404  WBC 6.2 4.6  NEUTROABS 4.4  --   HGB 10.4* 9.7*  HCT 34.1* 32.3*  MCV 105.9* 105.2*  PLT 187 171   Basic Metabolic Panel: Recent Labs  Lab 10/26/23 1645 10/27/23 0404  NA 137 137  K 3.9 4.0  CL 101 101  CO2 30 28  GLUCOSE 120* 163*  BUN 26* 23  CREATININE 1.01* 0.78  CALCIUM 9.1 8.8*   GFR: Estimated Creatinine Clearance: 45.2 mL/min (by C-G formula based on SCr of 0.78 mg/dL). Liver Function Tests: Recent Labs  Lab 10/26/23 1645  AST 22  ALT 18  ALKPHOS 48  BILITOT 0.7  PROT 6.8  ALBUMIN 3.7   No results for input(s): "LIPASE",  "AMYLASE" in the last 168 hours. No results for input(s): "AMMONIA" in the last 168 hours. Coagulation Profile: No results for input(s): "INR", "PROTIME" in the last 168 hours. Cardiac Enzymes: No results for input(s): "CKTOTAL", "CKMB", "CKMBINDEX", "TROPONINI" in the last 168 hours. BNP (last 3 results) No results for input(s): "PROBNP" in the last 8760 hours. HbA1C: No results for input(s): "HGBA1C" in the last 72 hours. CBG: No results for input(s): "GLUCAP" in the last 168 hours. Lipid Profile: No results for input(s): "CHOL", "HDL", "LDLCALC", "TRIG", "CHOLHDL", "LDLDIRECT" in the last 72 hours. Thyroid Function Tests: No results for input(s): "TSH", "T4TOTAL", "FREET4", "T3FREE", "THYROIDAB" in the last 72 hours. Anemia Panel: No results for input(s): "VITAMINB12", "FOLATE", "FERRITIN", "TIBC", "IRON", "RETICCTPCT" in the last 72 hours. Urine analysis:    Component Value Date/Time   COLORURINE YELLOW 01/29/2009 1323   APPEARANCEUR CLEAR 01/29/2009 1323   LABSPEC 1.008 01/29/2009 1323   PHURINE 7.5 01/29/2009 1323   GLUCOSEU NEGATIVE 01/29/2009 1323   HGBUR NEGATIVE 01/29/2009 1323   BILIRUBINUR NEGATIVE 01/29/2009 1323   KETONESUR NEGATIVE 01/29/2009 1323   PROTEINUR NEGATIVE 01/29/2009 1323   UROBILINOGEN 1.0 01/29/2009 1323   NITRITE NEGATIVE 01/29/2009 1323   LEUKOCYTESUR NEGATIVE 01/29/2009 1323   Sepsis Labs: @LABRCNTIP (procalcitonin:4,lacticidven:4)  )No results found for this or any previous visit (from the past 240 hours).    Studies: CT Angio Chest Pulmonary Embolism (PE) W or WO Contrast Result Date: 10/27/2023 CLINICAL DATA:  Pulmonary embolism suspected, shortness of breath and positive D-dimer. EXAM: CT ANGIOGRAPHY CHEST WITH CONTRAST TECHNIQUE: Multidetector CT imaging of the chest was performed using the standard protocol during bolus administration of intravenous contrast. Multiplanar CT image reconstructions and MIPs were obtained to evaluate the  vascular anatomy. RADIATION DOSE REDUCTION: This exam was performed according to the departmental dose-optimization program which includes automated exposure control, adjustment of the mA and/or kV according to patient size and/or use of iterative reconstruction technique. CONTRAST:  75mL OMNIPAQUE  IOHEXOL  350 MG/ML SOLN COMPARISON:  Report from 01/24/2022 FINDINGS: Cardiovascular: Satisfactory opacification of the pulmonary arteries to the segmental level. No evidence of pulmonary embolism. Enlarged heart size. No pericardial effusion. Extensive atheromatous calcification of the aorta, coronaries, and great vessels. Large main pulmonary artery consistent with pulmonary hypertension, 4 cm in diameter. Mediastinum/Nodes: Negative for mass or adenopathy. Lungs/Pleura: Airway thickening with patchy reticulation. Interlobular septal thickening bilaterally. There is airway opacification in the lower lobes with atelectasis greater on the right. No consolidation or effusion. No pneumothorax. Upper Abdomen: Hepatic venous reflux suggesting elevated right heart pressure. Musculoskeletal: No acute finding. Review of the MIP images confirms the above findings. IMPRESSION: Cardiomegaly, pulmonary hypertension, and interstitial edema. Negative for pulmonary embolism. Extensive atherosclerosis. Electronically Signed   By: Ronnette Coke M.D.   On: 10/27/2023 05:40   DG Chest 2 View Result Date: 10/26/2023 CLINICAL DATA:  Shortness of breath EXAM: CHEST - 2 VIEW COMPARISON:  Chest x-ray 04/05/2019. FINDINGS: Heart is mildly enlarged, unchanged. There are increased central interstitial markings similar to prior. There is no focal  lung infiltrate, pleural effusion or pneumothorax identified. No acute fractures are seen. IMPRESSION: 1. Mild cardiomegaly. 2. Increased central interstitial markings similar to prior, likely chronic. Electronically Signed   By: Tyron Gallon M.D.   On: 10/26/2023 17:44    Scheduled Meds:   atorvastatin  80 mg Oral Daily   azelastine  1 spray Each Nare BID   [START ON 10/28/2023] azithromycin  250 mg Oral Once per day on Monday Wednesday Friday   benazepril  20 mg Oral Daily   budesonide (PULMICORT) nebulizer solution  0.25 mg Nebulization BID   empagliflozin  10 mg Oral Daily   enoxaparin (LOVENOX) injection  40 mg Subcutaneous Q24H   furosemide  40 mg Oral Daily   ipratropium-albuterol  3 mL Nebulization TID   latanoprost  1 drop Both Eyes QHS   loratadine  10 mg Oral Daily   methylPREDNISolone (SOLU-MEDROL) injection  40 mg Intravenous Q24H   omega-3 acid ethyl esters  1 g Oral Daily    Continuous Infusions:   LOS: 1 day     Deke Tilghman J Myldred Raju, MD [facility]  If 7PM-7AM, please contact night-coverage [email or web address] 10/27/2023, 7:24 AM

## 2023-10-27 NOTE — Evaluation (Signed)
 Physical Therapy Evaluation Patient Details Name: Mia Howe MRN: [id number] DOB: 13-Oct-1940 Today's Date: 10/27/2023  History of Present Illness  Mia Howe is a 83 y.o. female with medical history significant of COPD, heart failure, hypertension and hyperlipidemia who presented with dyspnea.   Reported having dyspnea on exertion for several days, worsening in nature and associated with wheezing.   Yesterday her daughter called EMS, and she was found hypoxic. She was offered to be transported to the ED but she declined.  Today she was seen at her cardiology office, her 02 saturation was 60 to 70% on supplemental 02 per [name] 2L/min, then she was referred to the ED for further evaluation.      Patient has supplemental 02 at home uses 3 L/min and uses daily trilogy. She does not use nebulized albuterol often and she has not been adherent to her supplemental 02 at home.   She ambulates with no need of walker or cane.      She denies orthopnea, lower extremity edema or PND. No fever or chills, no worsening cough or increased sputum production.      At the time of my examination her dyspnea is moderate in intensity, she continues to have some wheezing, symptoms worse with exertion and improved with bronchodilator therapy   Clinical Impression  Patient functioning near baseline for functional mobility and gait, required use of RW for gait training with good return for ambulating in room, hallways, on 2 LPM initially, but desaturated from 98% to 85% during ambulation, had to increased to 3 LPM to keep above 90% returning to room. Patient tolerated sitting up in chair after therapy. Patient will benefit from continued skilled physical therapy in hospital and recommended venue below to increase strength, balance, endurance for safe ADLs and gait.           If plan is discharge home, recommend the following: A little help with walking and/or transfers;Help with stairs or ramp for entrance;A little help  with bathing/dressing/bathroom;Assistance with cooking/housework   Can travel by private vehicle        Equipment Recommendations None recommended by PT  Recommendations for Other Services       Functional Status Assessment Patient has had a recent decline in their functional status and demonstrates the ability to make significant improvements in function in a reasonable and predictable amount of time.     Precautions / Restrictions Precautions Precautions: Fall Restrictions Weight Bearing Restrictions Per Provider Order: No      Mobility  Bed Mobility Overal bed mobility: Modified Independent                  Transfers Overall transfer level: Needs assistance Equipment used: Rolling walker (2 wheels) Transfers: Sit to/from Stand, Bed to chair/wheelchair/BSC Sit to Stand: Supervision Stand pivot transfers: Supervision         General transfer comment: slightly labored movement    Ambulation/Gait Ambulation/Gait assistance: Supervision Gait Distance (Feet): 80 Feet Assistive device: Rolling walker (2 wheels) Gait Pattern/deviations: Decreased step length - right, Decreased step length - left, Decreased stride length Gait velocity: decreased     General Gait Details: slightly labored movement with good return for ambulating in room, hallway without loss of balance using RW  Stairs            Wheelchair Mobility     Tilt Bed    Modified Rankin (Stroke Patients Only)       Balance Overall balance assessment: Needs assistance Sitting-balance  support: Feet supported, No upper extremity supported Sitting balance-Leahy Scale: Good Sitting balance - Comments: seated at EOB   Standing balance support: During functional activity, Bilateral upper extremity supported Standing balance-Leahy Scale: Fair Standing balance comment: fair/good using RW                             Pertinent Vitals/Pain Pain Assessment Pain Assessment:  No/denies pain    Home Living Family/patient expects to be discharged to:: Private residence Living Arrangements: Children Available Help at Discharge: Family;Available 24 hours/day Type of Home: House Home Access: Stairs to enter Entrance Stairs-Rails: None Entrance Stairs-Number of Steps: 1   Home Layout: One level Home Equipment: Pharmacist, hospital (2 wheels);Cane - single point;Grab bars - tub/shower;BSC/3in1      Prior Function Prior Level of Function : Independent/Modified Independent             Mobility Comments: household ambulation using RW or SPC PRN, on 2 LPM home O2, but increases to 3 LPM when walking ADLs Comments: Assisted by family     Extremity/Trunk Assessment   Upper Extremity Assessment Upper Extremity Assessment: Defer to OT evaluation    Lower Extremity Assessment Lower Extremity Assessment: Overall WFL for tasks assessed    Cervical / Trunk Assessment Cervical / Trunk Assessment: Kyphotic  Communication   Communication Communication: No apparent difficulties    Cognition Arousal: Alert Behavior During Therapy: WFL for tasks assessed/performed   PT - Cognitive impairments: No apparent impairments                         Following commands: Intact       Cueing Cueing Techniques: Verbal cues, Tactile cues     General Comments      Exercises     Assessment/Plan    PT Assessment Patient needs continued PT services  PT Problem List Decreased strength;Decreased activity tolerance;Decreased balance;Decreased mobility       PT Treatment Interventions DME instruction;Gait training;Stair training;Functional mobility training;Therapeutic activities;Therapeutic exercise;Balance training;Patient/family education    PT Goals (Current goals can be found in the Care Plan section)  Acute Rehab PT Goals Patient Stated Goal: return home with family to assist PT Goal Formulation: With patient Time For Goal Achievement:  11/27/23 Potential to Achieve Goals: Good    Frequency Min 2X/week     Co-evaluation               AM-PAC PT "6 Clicks" Mobility  Outcome Measure Help needed turning from your back to your side while in a flat bed without using bedrails?: None Help needed moving from lying on your back to sitting on the side of a flat bed without using bedrails?: None Help needed moving to and from a bed to a chair (including a wheelchair)?: A Little Help needed standing up from a chair using your arms (e.g., wheelchair or bedside chair)?: A Little Help needed to walk in hospital room?: A Little Help needed climbing 3-5 steps with a railing? : A Little 6 Click Score: 20    End of Session Equipment Utilized During Treatment: Oxygen  Activity Tolerance: Patient tolerated treatment well;Patient limited by fatigue Patient left: in chair;with call bell/phone within reach Nurse Communication: Mobility status PT Visit Diagnosis: Unsteadiness on feet (R26.81);Other abnormalities of gait and mobility (R26.89);Muscle weakness (generalized) (M62.81)    Time: 1110-1140 PT Time Calculation (min) (ACUTE ONLY): 30 min   Charges:   PT  Evaluation $PT Eval Moderate Complexity: 1 Mod PT Treatments $Therapeutic Activity: 23-37 mins PT General Charges $$ ACUTE PT VISIT: 1 Visit         3:08 PM, 10/27/23 Walton Guppy, MPT Physical Therapist with [facility] [phone] office [phone] mobile phone

## 2023-10-28 DIAGNOSIS — J441 Chronic obstructive pulmonary disease with (acute) exacerbation: Secondary | ICD-10-CM | POA: Diagnosis not present

## 2023-10-28 LAB — CBC
HCT: 33.5 % — ABNORMAL LOW (ref 36.0–46.0)
Hemoglobin: 9.8 g/dL — ABNORMAL LOW (ref 12.0–15.0)
MCH: 31.2 pg (ref 26.0–34.0)
MCHC: 29.3 g/dL — ABNORMAL LOW (ref 30.0–36.0)
MCV: 106.7 fL — ABNORMAL HIGH (ref 80.0–100.0)
Platelets: 178 10*3/uL (ref 150–400)
RBC: 3.14 MIL/uL — ABNORMAL LOW (ref 3.87–5.11)
RDW: 13.4 % (ref 11.5–15.5)
WBC: 5.3 10*3/uL (ref 4.0–10.5)
nRBC: 0 % (ref 0.0–0.2)

## 2023-10-28 LAB — BASIC METABOLIC PANEL WITH GFR
Anion gap: 4 — ABNORMAL LOW (ref 5–15)
BUN: 24 mg/dL — ABNORMAL HIGH (ref 8–23)
CO2: 33 mmol/L — ABNORMAL HIGH (ref 22–32)
Calcium: 8.9 mg/dL (ref 8.9–10.3)
Chloride: 102 mmol/L (ref 98–111)
Creatinine, Ser: 0.94 mg/dL (ref 0.44–1.00)
GFR, Estimated: 60 mL/min — ABNORMAL LOW (ref >60)
Glucose, Bld: 133 mg/dL — ABNORMAL HIGH (ref 70–99)
Potassium: 4.5 mmol/L (ref 3.5–5.1)
Sodium: 139 mmol/L (ref 135–145)

## 2023-10-28 LAB — IRON AND TIBC
Iron: 50 ug/dL (ref 28–170)
Saturation Ratios: 17 % (ref 10.4–31.8)
TIBC: 294 ug/dL (ref 250–450)
UIBC: 244 ug/dL

## 2023-10-28 LAB — VITAMIN B12: Vitamin B-12: 251 pg/mL (ref 180–914)

## 2023-10-28 LAB — FOLATE: Folate: 15.9 ng/mL (ref >5.9)

## 2023-10-28 LAB — FERRITIN: Ferritin: 70 ng/mL (ref 11–307)

## 2023-10-28 MED ORDER — POLYETHYLENE GLYCOL 3350 17 G PO PACK: 17.0000 g | PACK | Freq: Every day | ORAL | 0 refills | Status: AC | PRN

## 2023-10-28 MED ORDER — POLYETHYLENE GLYCOL 3350 17 G PO PACK
17.0000 g | PACK | Freq: Two times a day (BID) | ORAL | Status: DC
  Administered 2023-10-28: 17 g via ORAL
  Filled 2023-10-28: qty 1

## 2023-10-28 MED ORDER — SENNOSIDES-DOCUSATE SODIUM 8.6-50 MG PO TABS: 1.0000 | ORAL_TABLET | Freq: Every evening | ORAL | 0 refills | Status: AC | PRN

## 2023-10-28 MED ORDER — VITAMIN B-12 1000 MCG PO TABS: 1000.0000 ug | ORAL_TABLET | Freq: Every day | ORAL | 0 refills | Status: AC

## 2023-10-28 MED ORDER — PREDNISONE 20 MG PO TABS: 40.0000 mg | ORAL_TABLET | Freq: Every day | ORAL | 0 refills | Status: AC

## 2023-10-28 MED ORDER — SENNOSIDES-DOCUSATE SODIUM 8.6-50 MG PO TABS
1.0000 | ORAL_TABLET | Freq: Two times a day (BID) | ORAL | Status: DC
  Filled 2023-10-28: qty 1

## 2023-10-28 NOTE — Progress Notes (Signed)
 OT Cancellation Note  Patient Details Name: BRALYN ESPINO MRN: [id number] DOB: 29-Apr-1941   Cancelled Treatment:    Reason Eval/Treat Not Completed: OT screened, no needs identified, will sign off. Pt appeared to be MOD I for mobility with physical therapy. Pt does not appear to be in need of an OT evaluation and will be removed from the OT list.   Thurnell Floss OT, MOT   Thurnell Floss 10/28/2023, 8:04 AM

## 2023-10-28 NOTE — Progress Notes (Signed)
 Mobility Specialist Progress Note:    10/28/23 1200  Mobility  Activity Ambulated with assistance in hallway  Level of Assistance Standby assist, set-up cues, supervision of patient - no hands on  Assistive Device Front wheel walker  Distance Ambulated (ft) 150 ft  Range of Motion/Exercises Active;All extremities  Activity Response Tolerated well  Mobility Referral Yes  Mobility visit 1 Mobility  Mobility Specialist Start Time (ACUTE ONLY) 1200  Mobility Specialist Stop Time (ACUTE ONLY) 1220  Mobility Specialist Time Calculation (min) (ACUTE ONLY) 20 min   Pt received in chair, agreeable to mobility. Required supervision to stand and ambulate with RW. Tolerated well, pulse oximetry was reading poorly during ambulation. Pt denied SOB throughout session on 3L. Returned to chair, all needs met.  Avionna Bower Mobility Specialist Please contact via Special educational needs teacher or  Rehab office at [phone]

## 2023-10-28 NOTE — Discharge Summary (Addendum)
 Physician Discharge Summary   Patient: Mia Howe MRN: [id number] DOB: 1941-01-16  Admit date:     10/26/2023  Discharge date: 10/28/23  Discharge Physician: Veronica Gordon   PCP: Veda Gerald, MD   Recommendations at discharge:   Follow-up with PCP  Discharge Diagnoses: Principal Problem:   COPD exacerbation (HCC) Active Problems:   Essential hypertension   Chronic diastolic CHF (congestive heart failure) (HCC)   Dyslipidemia    Hospital Course: Mia Howe is a 83 y.o. female with medical history significant of COPD, heart failure, HTN, HLD, presented with worsening dyspnea for several days, associated with wheezing. EMS was activated, found to be hypoxic, but refused to go to the ER. PTA, she was seen at her cardiology office, her 02 saturation was 60 to 70% on supplemental 02 per [place] 2L/min. Patient was advised to go to the ED for further evaluation. Patient has not been compliant with her supplemental 02 at home (on 3 L/min) and uses daily trilogy.  In the ED, noted to be mildly tachycardic, sats 89% on 2 L, but increased to 97% on 3 L.  Labs notable for creatinine of 1, BNP 397, hemoglobin 10.4 currently around baseline.  Chest x-ray showed mild cardiomegaly, increased central interstitial marking, likely chronic.  Patient admitted for further management.     Today, patient denies any new complaints, able to ambulate the hallway on 3 L of O2 which is patient's current baseline without any desaturations.  Patient denies any chest pain, worsening shortness of breath/dyspnea, fever/chills.    Assessment and Plan:  COPD exacerbation Acute on chronic hypoxemic respiratory failure Currently afebrile, with no leukocytosis Currently saturating well on baseline 3 L of O2 Chest x-ray with mild cardiomegaly, increased central interstitial marking, likely chronic Continue nebulizers, steroids x 3 more doses, azithromycin Flutter valve and incentive  spirometer Continue supplemental O2 (advised to be compliant)   Essential hypertension Continue benazepril   Chronic diastolic CHF (congestive heart failure) (HCC) BNP 397 Echocardiogram with preserved LV systolic function, no RWMA, left ventricular diastolic parameters are indeterminate Continue benazepril, Lasix   Macrocytic anemia Vit B12 deficiency Hemoglobin around baseline 10 Anemia panel iron 50, sats 17, ferritin 70, folate 15.9, Vit B12-->251   Dyslipidemia Continue with statin therapy        Consultants: None Procedures performed: None Disposition: Home  Diet recommendation:  Discharge Diet Orders (From admission, onward)     Start     Ordered   10/28/23 0000  Diet - low sodium heart healthy        10/28/23 1415            DISCHARGE MEDICATION: Allergies as of 10/28/2023       Reactions   Levofloxacin Dermatitis, Rash        Medication List     TAKE these medications    albuterol 108 (90 Base) MCG/ACT inhaler Commonly known as: VENTOLIN HFA 2 puffs every 4 (four) hours as needed for wheezing or shortness of breath.   albuterol (2.5 MG/3ML) 0.083% nebulizer solution Commonly known as: PROVENTIL Take 2.5 mg by nebulization every 4 (four) hours as needed for wheezing or shortness of breath.   alendronate 70 MG tablet Commonly known as: FOSAMAX Take 70 mg by mouth once a week.   atorvastatin 80 MG tablet Commonly known as: LIPITOR Take 80 mg by mouth daily.   azelastine 0.1 % nasal spray Commonly known as: ASTELIN Place 1 spray into both nostrils 2 (two) times  daily. Use in each nostril as directed   azithromycin 250 MG tablet Commonly known as: ZITHROMAX Take 250 mg by mouth 3 (three) times a week.   benazepril 20 MG tablet Commonly known as: LOTENSIN Take 20 mg by mouth daily.   BIOTIN PO Take 1 tablet by mouth daily.   CALCIUM MAGNESIUM PO Take 1 tablet by mouth 2 (two) times daily.   cyanocobalamin 1000 MCG  tablet Commonly known as: VITAMIN B12 Take 1 tablet (1,000 mcg total) by mouth daily.   empagliflozin 10 MG Tabs tablet Commonly known as: JARDIANCE Take 10 mg by mouth daily.   furosemide 40 MG tablet Commonly known as: LASIX Take 40 mg by mouth daily.   HYDROcodone-acetaminophen 7.5-325 MG tablet Commonly known as: NORCO Take 1 tablet by mouth 2 (two) times daily as needed for moderate pain (pain score 4-6).   latanoprost 0.005 % ophthalmic solution Commonly known as: XALATAN Place 1 drop into both eyes at bedtime.   levocetirizine 5 MG tablet Commonly known as: XYZAL Take 5 mg by mouth daily.   Omega-3 1000 MG Caps Take 1,000 mg by mouth daily at 6 (six) AM. XL   OXYGEN  Inhale 2-3 L into the lungs continuous.   polyethylene glycol 17 g packet Commonly known as: MIRALAX / GLYCOLAX Take 17 g by mouth daily as needed.   predniSONE 20 MG tablet Commonly known as: DELTASONE Take 2 tablets (40 mg total) by mouth daily for 3 days.   senna-docusate 8.6-50 MG tablet Commonly known as: Senokot-S Take 1 tablet by mouth at bedtime as needed for mild constipation.   Trelegy Ellipta 100-62.5-25 MCG/ACT Aepb Generic drug: Fluticasone-Umeclidin-Vilant Take 1 puff by mouth daily.        Follow-up Information     Veda Gerald, MD. Schedule an appointment as soon as possible for a visit in 1 week(s).   Specialty: Internal Medicine Contact information: [address] [place] [address] [phone]                Discharge Exam: Filed Weights   10/26/23 1625  Weight: 65.3 kg   General: NAD  Cardiovascular: S1, S2 present Respiratory: CTAB Abdomen: Soft, nontender, nondistended, bowel sounds present Musculoskeletal: No bilateral pedal edema noted Skin: Normal Psychiatry: Normal mood   Condition at discharge: stable  The results of significant diagnostics from this hospitalization (including imaging, microbiology, ancillary and laboratory) are  listed below for reference.   Imaging Studies: ECHOCARDIOGRAM COMPLETE Result Date: 10/27/2023    ECHOCARDIOGRAM REPORT   Patient Name:   Mia Howe Date of Exam: 10/27/2023 Medical Rec #:  [id number]        Height:       64.0 in Accession #:    [id number]       Weight:       144.0 lb Date of Birth:  01-28-1941         BSA:          1.701 m Patient Age:    83 years         BP:           120/65 mmHg Patient Gender: F                HR:           86 bpm. Exam Location:  [facility] Procedure: 2D Echo, Color Doppler and Cardiac Doppler (Both Spectral and Color  Flow Doppler were utilized during procedure). Indications:    R06.9 DOE  History:        Patient has no prior history of Echocardiogram examinations.  Sonographer:    Andrena Bang Referring Phys: [id number] MAURICIO DANIEL ARRIEN IMPRESSIONS  1. Left ventricular ejection fraction, by estimation, is 60 to 65%. The left ventricle has normal function. The left ventricle has no regional wall motion abnormalities. Left ventricular diastolic parameters are indeterminate.  2. Right ventricular systolic function is normal. The right ventricular size is normal.  3. Left atrial size was moderately dilated.  4. The mitral valve is abnormal. Mild mitral valve regurgitation. Moderate mitral stenosis.  5. The aortic valve was not well visualized. There is moderate calcification of the aortic valve. Aortic valve regurgitation is not visualized. Aortic valve sclerosis/calcification is present, without any evidence of aortic stenosis.  6. The inferior vena cava is normal in size with greater than 50% respiratory variability, suggesting right atrial pressure of 3 mmHg. FINDINGS  Left Ventricle: Left ventricular ejection fraction, by estimation, is 60 to 65%. The left ventricle has normal function. The left ventricle has no regional wall motion abnormalities. Strain was performed and the global longitudinal strain is indeterminate. The left ventricular internal cavity  size was normal in size. There is no left ventricular hypertrophy. Left ventricular diastolic parameters are indeterminate. Right Ventricle: The right ventricular size is normal. No increase in right ventricular wall thickness. Right ventricular systolic function is normal. Left Atrium: Left atrial size was moderately dilated. Right Atrium: Right atrial size was normal in size. Pericardium: There is no evidence of pericardial effusion. Mitral Valve: The mitral valve is abnormal. There is moderate thickening of the mitral valve leaflet(s). There is moderate calcification of the mitral valve leaflet(s). Mild mitral valve regurgitation. Moderate mitral valve stenosis. MV peak gradient, 15.1 mmHg. The mean mitral valve gradient is 7.0 mmHg. Tricuspid Valve: The tricuspid valve is normal in structure. Tricuspid valve regurgitation is trivial. No evidence of tricuspid stenosis. Aortic Valve: The aortic valve was not well visualized. There is moderate calcification of the aortic valve. Aortic valve regurgitation is not visualized. Aortic valve sclerosis/calcification is present, without any evidence of aortic stenosis. Aortic valve mean gradient measures 5.0 mmHg. Aortic valve peak gradient measures 9.7 mmHg. Aortic valve area, by VTI measures 2.73 cm. Pulmonic Valve: The pulmonic valve was normal in structure. Pulmonic valve regurgitation is not visualized. No evidence of pulmonic stenosis. Aorta: The aortic root is normal in size and structure. Venous: The inferior vena cava is normal in size with greater than 50% respiratory variability, suggesting right atrial pressure of 3 mmHg. IAS/Shunts: No atrial level shunt detected by color flow Doppler. Additional Comments: 3D was performed not requiring image post processing on an independent workstation and was indeterminate.  LEFT VENTRICLE PLAX 2D LVIDd:         4.70 cm     Diastology LVIDs:         3.20 cm     LV e' medial:    5.87 cm/s LV PW:         1.50 cm     LV E/e'  medial:  27.4 LV IVS:        0.90 cm     LV e' lateral:   8.81 cm/s LVOT diam:     2.00 cm     LV E/e' lateral: 18.3 LV SV:         68 LV SV Index:   40 LVOT Area:  3.14 cm  LV Volumes (MOD) LV vol d, MOD A2C: 78.3 ml LV vol d, MOD A4C: 84.8 ml LV vol s, MOD A2C: 29.1 ml LV vol s, MOD A4C: 35.2 ml LV SV MOD A2C:     49.2 ml LV SV MOD A4C:     84.8 ml LV SV MOD BP:      50.8 ml RIGHT VENTRICLE RV S prime:     10.90 cm/s TAPSE (M-mode): 2.2 cm LEFT ATRIUM             Index LA diam:        4.30 cm 2.53 cm/m LA Vol (A2C):   85.2 ml 50.08 ml/m LA Vol (A4C):   63.9 ml 37.56 ml/m LA Biplane Vol: 76.1 ml 44.73 ml/m  AORTIC VALVE AV Area (Vmax):    2.34 cm AV Area (Vmean):   2.08 cm AV Area (VTI):     2.73 cm AV Vmax:           156.00 cm/s AV Vmean:          99.300 cm/s AV VTI:            0.247 m AV Peak Grad:      9.7 mmHg AV Mean Grad:      5.0 mmHg LVOT Vmax:         116.00 cm/s LVOT Vmean:        65.800 cm/s LVOT VTI:          0.215 m LVOT/AV VTI ratio: 0.87  AORTA Ao Asc diam: 2.40 cm MITRAL VALVE MV Area (PHT): 3.27 cm     SHUNTS MV Area VTI:   1.27 cm     Systemic VTI:  0.22 m MV Peak grad:  15.1 mmHg    Systemic Diam: 2.00 cm MV Mean grad:  7.0 mmHg MV Vmax:       1.94 m/s MV Vmean:      122.0 cm/s MV Decel Time: 232 msec MV E velocity: 161.00 cm/s MV A velocity: 160.00 cm/s MV E/A ratio:  1.01 Janelle Mediate MD Electronically signed by Janelle Mediate MD Signature Date/Time: 10/27/2023/2:50:04 PM    Final    CT Angio Chest Pulmonary Embolism (PE) W or WO Contrast Result Date: 10/27/2023 CLINICAL DATA:  Pulmonary embolism suspected, shortness of breath and positive D-dimer. EXAM: CT ANGIOGRAPHY CHEST WITH CONTRAST TECHNIQUE: Multidetector CT imaging of the chest was performed using the standard protocol during bolus administration of intravenous contrast. Multiplanar CT image reconstructions and MIPs were obtained to evaluate the vascular anatomy. RADIATION DOSE REDUCTION: This exam was performed according  to the departmental dose-optimization program which includes automated exposure control, adjustment of the mA and/or kV according to patient size and/or use of iterative reconstruction technique. CONTRAST:  75mL OMNIPAQUE  IOHEXOL  350 MG/ML SOLN COMPARISON:  Report from 01/24/2022 FINDINGS: Cardiovascular: Satisfactory opacification of the pulmonary arteries to the segmental level. No evidence of pulmonary embolism. Enlarged heart size. No pericardial effusion. Extensive atheromatous calcification of the aorta, coronaries, and great vessels. Large main pulmonary artery consistent with pulmonary hypertension, 4 cm in diameter. Mediastinum/Nodes: Negative for mass or adenopathy. Lungs/Pleura: Airway thickening with patchy reticulation. Interlobular septal thickening bilaterally. There is airway opacification in the lower lobes with atelectasis greater on the right. No consolidation or effusion. No pneumothorax. Upper Abdomen: Hepatic venous reflux suggesting elevated right heart pressure. Musculoskeletal: No acute finding. Review of the MIP images confirms the above findings. IMPRESSION: Cardiomegaly, pulmonary hypertension, and interstitial edema. Negative for pulmonary  embolism. Extensive atherosclerosis. Electronically Signed   By: Ronnette Coke M.D.   On: 10/27/2023 05:40   DG Chest 2 View Result Date: 10/26/2023 CLINICAL DATA:  Shortness of breath EXAM: CHEST - 2 VIEW COMPARISON:  Chest x-ray 04/05/2019. FINDINGS: Heart is mildly enlarged, unchanged. There are increased central interstitial markings similar to prior. There is no focal lung infiltrate, pleural effusion or pneumothorax identified. No acute fractures are seen. IMPRESSION: 1. Mild cardiomegaly. 2. Increased central interstitial markings similar to prior, likely chronic. Electronically Signed   By: Tyron Gallon M.D.   On: 10/26/2023 17:44    Microbiology: Results for orders placed or performed during the hospital encounter of 04/04/22  Resp  Panel by RT-PCR (Flu A&B, Covid) Anterior Nasal Swab     Status: None   Collection Time: 04/04/22  4:36 PM   Specimen: Anterior Nasal Swab  Result Value Ref Range Status   SARS Coronavirus 2 by RT PCR NEGATIVE NEGATIVE Final    Comment: (NOTE) SARS-CoV-2 target nucleic acids are NOT DETECTED.  The SARS-CoV-2 RNA is generally detectable in upper respiratory specimens during the acute phase of infection. The lowest concentration of SARS-CoV-2 viral copies this assay can detect is 138 copies/mL. A negative result does not preclude SARS-Cov-2 infection and should not be used as the sole basis for treatment or other patient management decisions. A negative result may occur with  improper specimen collection/handling, submission of specimen other than nasopharyngeal swab, presence of viral mutation(s) within the areas targeted by this assay, and inadequate number of viral copies(<138 copies/mL). A negative result must be combined with clinical observations, patient history, and epidemiological information. The expected result is Negative.  Fact Sheet for Patients:  BloggerCourse.com  Fact Sheet for Healthcare Providers:  SeriousBroker.it  This test is no t yet approved or cleared by the [place]  FDA and  has been authorized for detection and/or diagnosis of SARS-CoV-2 by FDA under an Emergency Use Authorization (EUA). This EUA will remain  in effect (meaning this test can be used) for the duration of the COVID-19 declaration under Section 564(b)(1) of the Act, 21 U.S.C.section 360bbb-3(b)(1), unless the authorization is terminated  or revoked sooner.       Influenza A by PCR NEGATIVE NEGATIVE Final   Influenza B by PCR NEGATIVE NEGATIVE Final    Comment: (NOTE) The Xpert Xpress SARS-CoV-2/FLU/RSV plus assay is intended as an aid in the diagnosis of influenza from Nasopharyngeal swab specimens and should not be used as a sole  basis for treatment. Nasal washings and aspirates are unacceptable for Xpert Xpress SARS-CoV-2/FLU/RSV testing.  Fact Sheet for Patients: BloggerCourse.com  Fact Sheet for Healthcare Providers: SeriousBroker.it  This test is not yet approved or cleared by the [place]  FDA and has been authorized for detection and/or diagnosis of SARS-CoV-2 by FDA under an Emergency Use Authorization (EUA). This EUA will remain in effect (meaning this test can be used) for the duration of the COVID-19 declaration under Section 564(b)(1) of the Act, 21 U.S.C. section 360bbb-3(b)(1), unless the authorization is terminated or revoked.  Performed at [facility], [address], [place], [place] [address]     Labs: CBC: Recent Labs  Lab 10/26/23 1645 10/27/23 0404 10/28/23 0405  WBC 6.2 4.6 5.3  NEUTROABS 4.4  --   --   HGB 10.4* 9.7* 9.8*  HCT 34.1* 32.3* 33.5*  MCV 105.9* 105.2* 106.7*  PLT 187 171 178   Basic Metabolic Panel: Recent Labs  Lab 10/26/23 1645 10/27/23 0404  10/28/23 0405  NA 137 137 139  K 3.9 4.0 4.5  CL 101 101 102  CO2 30 28 33*  GLUCOSE 120* 163* 133*  BUN 26* 23 24*  CREATININE 1.01* 0.78 0.94  CALCIUM 9.1 8.8* 8.9   Liver Function Tests: Recent Labs  Lab 10/26/23 1645  AST 22  ALT 18  ALKPHOS 48  BILITOT 0.7  PROT 6.8  ALBUMIN 3.7   CBG: No results for input(s): "GLUCAP" in the last 168 hours.  Discharge time spent: greater than 30 minutes.  Signed: Veronica Gordon, MD [facility] 10/28/2023

## 2023-10-28 NOTE — Care Management Important Message (Signed)
 Important Message  Patient Details  Name: Mia Howe MRN: [id number] Date of Birth: 10/28/1940   Important Message Given:  Yes - Medicare IM     Anavi Branscum L Tiyon Sanor 10/28/2023, 11:29 AM
